# Patient Record
Sex: Male | Born: 1970 | Race: White | Hispanic: No | Marital: Married | Smoking: Current every day smoker
Health system: Southern US, Community
[De-identification: ages and names within clinical notes are randomized; demographics above are authoritative.]

## PROBLEM LIST (undated history)

## (undated) DIAGNOSIS — F419 Anxiety disorder, unspecified: Secondary | ICD-10-CM

## (undated) DIAGNOSIS — G709 Myoneural disorder, unspecified: Secondary | ICD-10-CM

## (undated) DIAGNOSIS — T7840XA Allergy, unspecified, initial encounter: Secondary | ICD-10-CM

## (undated) DIAGNOSIS — M1990 Unspecified osteoarthritis, unspecified site: Secondary | ICD-10-CM

## (undated) HISTORY — DX: Allergy, unspecified, initial encounter: T78.40XA

## (undated) HISTORY — PX: MOUTH SURGERY: SHX715

## (undated) HISTORY — DX: Myoneural disorder, unspecified: G70.9

## (undated) HISTORY — DX: Unspecified osteoarthritis, unspecified site: M19.90

## (undated) HISTORY — DX: Anxiety disorder, unspecified: F41.9

## (undated) HISTORY — PX: NO PAST SURGERIES: SHX2092

---

## 2004-09-20 ENCOUNTER — Emergency Department (HOSPITAL_COMMUNITY): Admission: EM | Admit: 2004-09-20 | Discharge: 2004-09-20 | Payer: Self-pay | Admitting: Family Medicine

## 2008-11-29 ENCOUNTER — Emergency Department (HOSPITAL_COMMUNITY): Admission: EM | Admit: 2008-11-29 | Discharge: 2008-11-29 | Payer: Self-pay | Admitting: Family Medicine

## 2011-01-05 ENCOUNTER — Encounter: Payer: Self-pay | Admitting: *Deleted

## 2011-01-05 ENCOUNTER — Emergency Department (INDEPENDENT_AMBULATORY_CARE_PROVIDER_SITE_OTHER)
Admission: EM | Admit: 2011-01-05 | Discharge: 2011-01-05 | Disposition: A | Discharge status: Home / Self Care | Payer: Worker's Compensation | Source: Home / Self Care | Attending: Emergency Medicine | Admitting: Emergency Medicine

## 2011-01-05 DIAGNOSIS — S61412A Laceration without foreign body of left hand, initial encounter: Secondary | ICD-10-CM

## 2011-01-05 DIAGNOSIS — S61409A Unspecified open wound of unspecified hand, initial encounter: Secondary | ICD-10-CM

## 2011-01-05 MED ORDER — TETANUS-DIPHTH-ACELL PERTUSSIS 5-2.5-18.5 LF-MCG/0.5 IM SUSP
INTRAMUSCULAR | Status: AC
  Filled 2011-01-05: qty 0.5

## 2011-01-05 MED ORDER — TETANUS-DIPHTH-ACELL PERTUSSIS 5-2.5-18.5 LF-MCG/0.5 IM SUSP
0.5000 mL | Freq: Once | INTRAMUSCULAR | Status: AC
  Administered 2011-01-05: 0.5 mL via INTRAMUSCULAR

## 2011-01-05 NOTE — ED Provider Notes (Signed)
History     CSN: [id number] Arrival date & time: 01/05/2011  4:16 PM   First MD Initiated Contact with Patient 01/05/11 1441      Chief Complaint  Patient presents with  . Laceration    (Consider location/radiation/quality/duration/timing/severity/associated sxs/prior treatment) HPI Comments: wAS WORKING CUT MY HAND WITH A DRILL. HAVE A LACERATION ON MY LEFT HAND, IT STOPPED BLEEDING, ITS SORE AND HURTS"  Patient is a 40 y.o. male presenting with skin laceration. The history is provided by the patient.  Laceration  The incident occurred 1 to 2 hours ago. The laceration is located on the left hand. The laceration is 2 cm in size. The pain is at a severity of 5/10. The pain is moderate. The pain has been constant since onset. He reports no foreign bodies present. His tetanus status is out of date.    History reviewed. No pertinent past medical history.  History reviewed. No pertinent past surgical history.  History reviewed. No pertinent family history.  History  Substance Use Topics  . Smoking status: Never Smoker   . Smokeless tobacco: Not on file  . Alcohol Use: Yes      Review of Systems  Constitutional: Negative for fever.  Neurological: Negative for weakness and numbness.    Allergies  Review of patient's allergies indicates no known allergies.  Home Medications  No current outpatient prescriptions on file.  BP 141/89  Pulse 69  Temp(Src) 97.7 F (36.5 C) (Oral)  Resp 16  SpO2 100%  Physical Exam  Nursing note and vitals reviewed. Constitutional: He appears well-developed.  Musculoskeletal:       Left hand: He exhibits laceration. He exhibits normal range of motion, normal two-point discrimination and no swelling. normal sensation noted. Normal strength noted. He exhibits no finger abduction, no thumb/finger opposition and no wrist extension trouble.       Hands: Neurological: He is alert. No cranial nerve deficit or sensory deficit. He exhibits normal  muscle tone.  Skin: Skin is warm. No erythema.    ED Course  LACERATION REPAIR Performed by: Kentrell Hallahan Authorized by: Jimmie Molly Consent: Verbal consent obtained. Consent given by: patient Body area: upper extremity Location details: left hand Laceration length: 2 cm Contamination: The wound is contaminated. Tendon involvement: none Nerve involvement: none Vascular damage: no Anesthesia: local infiltration Local anesthetic: lidocaine 1% without epinephrine Anesthetic total: 5 ml Patient sedated: no Irrigation solution: saline Debridement: minimal Skin closure: 4-0 Prolene Number of sutures: 3 Technique: simple Approximation: close Approximation difficulty: simple Dressing: antibiotic ointment   (including critical care time)  Labs Reviewed - No data to display No results found.   1. Laceration of left hand       MDM  Laceration- without tendon involvement Full ROM        Jimmie Molly, MD 01/05/11 1655

## 2011-01-05 NOTE — ED Notes (Signed)
Laceration left hand between thumb and index finger onset approx 2 hours ago with drill

## 2014-01-22 ENCOUNTER — Emergency Department (HOSPITAL_COMMUNITY)
Admission: EM | Admit: 2014-01-22 | Discharge: 2014-01-22 | Disposition: A | Discharge status: Home / Self Care | Payer: BC Managed Care – PPO | Source: Home / Self Care | Attending: Family Medicine | Admitting: Family Medicine

## 2014-01-22 ENCOUNTER — Encounter (HOSPITAL_COMMUNITY): Payer: Self-pay | Admitting: Emergency Medicine

## 2014-01-22 DIAGNOSIS — J0100 Acute maxillary sinusitis, unspecified: Secondary | ICD-10-CM

## 2014-01-22 DIAGNOSIS — K0889 Other specified disorders of teeth and supporting structures: Secondary | ICD-10-CM

## 2014-01-22 DIAGNOSIS — J069 Acute upper respiratory infection, unspecified: Secondary | ICD-10-CM

## 2014-01-22 DIAGNOSIS — K088 Other specified disorders of teeth and supporting structures: Secondary | ICD-10-CM

## 2014-01-22 DIAGNOSIS — R0982 Postnasal drip: Secondary | ICD-10-CM

## 2014-01-22 DIAGNOSIS — K047 Periapical abscess without sinus: Secondary | ICD-10-CM

## 2014-01-22 MED ORDER — AMOXICILLIN 500 MG PO CAPS
1000.0000 mg | ORAL_CAPSULE | Freq: Two times a day (BID) | ORAL | Status: DC
Start: 2014-01-22 — End: 2015-07-20

## 2014-01-22 NOTE — Discharge Instructions (Signed)
Upper Respiratory Infection, Adult Lots of nasal saline and often Pseudoephedrine for congestion  An upper respiratory infection (URI) is also sometimes known as the common cold. The upper respiratory tract includes the nose, sinuses, throat, trachea, and bronchi. Bronchi are the airways leading to the lungs. Most people improve within 1 week, but symptoms can last up to 2 weeks. A residual cough may last even longer.  CAUSES Many different viruses can infect the tissues lining the upper respiratory tract. The tissues become irritated and inflamed and often become very moist. Mucus production is also common. A cold is contagious. You can easily spread the virus to others by oral contact. This includes kissing, sharing a glass, coughing, or sneezing. Touching your mouth or nose and then touching a surface, which is then touched by another person, can also spread the virus. SYMPTOMS  Symptoms typically develop 1 to 3 days after you come in contact with a cold virus. Symptoms vary from person to person. They may include:  Runny nose.  Sneezing.  Nasal congestion.  Sinus irritation.  Sore throat.  Loss of voice (laryngitis).  Cough.  Fatigue.  Muscle aches.  Loss of appetite.  Headache.  Low-grade fever. DIAGNOSIS  You might diagnose your own cold based on familiar symptoms, since most people get a cold 2 to 3 times a year. Your caregiver can confirm this based on your exam. Most importantly, your caregiver can check that your symptoms are not due to another disease such as strep throat, sinusitis, pneumonia, asthma, or epiglottitis. Blood tests, throat tests, and X-rays are not necessary to diagnose a common cold, but they may sometimes be helpful in excluding other more serious diseases. Your caregiver will decide if any further tests are required. RISKS AND COMPLICATIONS  You may be at risk for a more severe case of the common cold if you smoke cigarettes, have chronic heart  disease (such as heart failure) or lung disease (such as asthma), or if you have a weakened immune system. The very young and very old are also at risk for more serious infections. Bacterial sinusitis, middle ear infections, and bacterial pneumonia can complicate the common cold. The common cold can worsen asthma and chronic obstructive pulmonary disease (COPD). Sometimes, these complications can require emergency medical care and may be life-threatening. PREVENTION  The best way to protect against getting a cold is to practice good hygiene. Avoid oral or hand contact with people with cold symptoms. Wash your hands often if contact occurs. There is no clear evidence that vitamin C, vitamin E, echinacea, or exercise reduces the chance of developing a cold. However, it is always recommended to get plenty of rest and practice good nutrition. TREATMENT  Treatment is directed at relieving symptoms. There is no cure. Antibiotics are not effective, because the infection is caused by a virus, not by bacteria. Treatment may include:  Increased fluid intake. Sports drinks offer valuable electrolytes, sugars, and fluids.  Breathing heated mist or steam (vaporizer or shower).  Eating chicken soup or other clear broths, and maintaining good nutrition.  Getting plenty of rest.  Using gargles or lozenges for comfort.  Controlling fevers with ibuprofen or acetaminophen as directed by your caregiver.  Increasing usage of your inhaler if you have asthma. Zinc gel and zinc lozenges, taken in the first 24 hours of the common cold, can shorten the duration and lessen the severity of symptoms. Pain medicines may help with fever, muscle aches, and throat pain. A variety of non-prescription medicines  are available to treat congestion and runny nose. Your caregiver can make recommendations and may suggest nasal or lung inhalers for other symptoms.  HOME CARE INSTRUCTIONS   Only take over-the-counter or prescription  medicines for pain, discomfort, or fever as directed by your caregiver.  Use a warm mist humidifier or inhale steam from a shower to increase air moisture. This may keep secretions moist and make it easier to breathe.  Drink enough water and fluids to keep your urine clear or pale yellow.  Rest as needed.  Return to work when your temperature has returned to normal or as your caregiver advises. You may need to stay home longer to avoid infecting others. You can also use a face mask and careful hand washing to prevent spread of the virus. SEEK MEDICAL CARE IF:   After the first few days, you feel you are getting worse rather than better.  You need your caregiver's advice about medicines to control symptoms.  You develop chills, worsening shortness of breath, or brown or red sputum. These may be signs of pneumonia.  You develop white or brown nasal discharge or pain in the face, especially when you bend forward. These may be signs of sinusitis.  You develop a fever, swollen neck glands, pain with swallowing, or white areas in the back of your throat. These may be signs of strep throat. SEEK IMMEDIATE MEDICAL CARE IF:   You have a fever.  You develop severe or persistent headache, ear pain, sinus pain, or chest pain.  You develop wheezing, a prolonged cough, cough up blood, or have a change in your usual mucus (if you have chronic lung disease).  You develop sore muscles or a stiff neck. Document Released: 07/10/2000 Document Revised: 04/08/2011 Document Reviewed: 04/21/2013 [facility] Patient Information 2015 [place], [place]. This information is not intended to replace advice given to you by your health care provider. Make sure you discuss any questions you have with your health care provider.  Sinusitis Sinusitis is redness, soreness, and puffiness (inflammation) of the air pockets in the bones of your face (sinuses). The redness, soreness, and puffiness can cause air and mucus to get  trapped in your sinuses. This can allow germs to grow and cause an infection.  HOME CARE   Drink enough fluids to keep your pee (urine) clear or pale yellow.  Use a humidifier in your home.  Run a hot shower to create steam in the bathroom. Sit in the bathroom with the door closed. Breathe in the steam 3-4 times a day.  Put a warm, moist washcloth on your face 3-4 times a day, or as told by your doctor.  Use salt water sprays (saline sprays) to wet the thick fluid in your nose. This can help the sinuses drain.  Only take medicine as told by your doctor. GET HELP RIGHT AWAY IF:   Your pain gets worse.  You have very bad headaches.  You are sick to your stomach (nauseous).  You throw up (vomit).  You are very sleepy (drowsy) all the time.  Your face is puffy (swollen).  Your vision changes.  You have a stiff neck.  You have trouble breathing. MAKE SURE YOU:   Understand these instructions.  Will watch your condition.  Will get help right away if you are not doing well or get worse. Document Released: 07/03/2007 Document Revised: 10/09/2011 Document Reviewed: 08/20/2011 [facility] Patient Information 2015 [place], [place]. This information is not intended to replace advice given to you by  your health care provider. Make sure you discuss any questions you have with your health care provider.  Dental Pain A tooth ache may be caused by cavities (tooth decay). Cavities expose the nerve of the tooth to air and hot or cold temperatures. It may come from an infection or abscess (also called a boil or furuncle) around your tooth. It is also often caused by dental caries (tooth decay). This causes the pain you are having. DIAGNOSIS  Your caregiver can diagnose this problem by exam. TREATMENT   If caused by an infection, it may be treated with medications which kill germs (antibiotics) and pain medications as prescribed by your caregiver. Take medications as directed.  Only take  over-the-counter or prescription medicines for pain, discomfort, or fever as directed by your caregiver.  Whether the tooth ache today is caused by infection or dental disease, you should see your dentist as soon as possible for further care. SEEK MEDICAL CARE IF: The exam and treatment you received today has been provided on an emergency basis only. This is not a substitute for complete medical or dental care. If your problem worsens or new problems (symptoms) appear, and you are unable to meet with your dentist, call or return to this location. SEEK IMMEDIATE MEDICAL CARE IF:   You have a fever.  You develop redness and swelling of your face, jaw, or neck.  You are unable to open your mouth.  You have severe pain uncontrolled by pain medicine. MAKE SURE YOU:   Understand these instructions.  Will watch your condition.  Will get help right away if you are not doing well or get worse. Document Released: 01/14/2005 Document Revised: 04/08/2011 Document Reviewed: 09/02/2007 [facility] Patient Information 2015 [place]. This information is not intended to replace advice given to you by your health care provider. Make sure you discuss any questions you have with your health care provider.  Dental Care and Dentist Visits Dental care supports good overall health. Regular dental visits can also help you avoid dental pain, bleeding, infection, and other more serious health problems in the future. It is important to keep the mouth healthy because diseases in the teeth, gums, and other oral tissues can spread to other areas of the body. Some problems, such as diabetes, heart disease, and pre-term labor have been associated with poor oral health.  See your dentist every 6 months. If you experience emergency problems such as a toothache or broken tooth, go to the dentist right away. If you see your dentist regularly, you may catch problems early. It is easier to be treated for problems in the  early stages.  WHAT TO EXPECT AT A DENTIST VISIT  Your dentist will look for many common oral health problems and recommend proper treatment. At your regular dental visit, you can expect:  Gentle cleaning of the teeth and gums. This includes scraping and polishing. This helps to remove the sticky substance around the teeth and gums (plaque). Plaque forms in the mouth shortly after eating. Over time, plaque hardens on the teeth as tartar. If tartar is not removed regularly, it can cause problems. Cleaning also helps remove stains.  Periodic X-rays. These pictures of the teeth and supporting bone will help your dentist assess the health of your teeth.  Periodic fluoride treatments. Fluoride is a natural mineral shown to help strengthen teeth. Fluoride treatmentinvolves applying a fluoride gel or varnish to the teeth. It is most commonly done in children.  Examination of the mouth, tongue,  jaws, teeth, and gums to look for any oral health problems, such as:  Cavities (dental caries). This is decay on the tooth caused by plaque, sugar, and acid in the mouth. It is best to catch a cavity when it is small.  Inflammation of the gums caused by plaque buildup (gingivitis).  Problems with the mouth or malformed or misaligned teeth.  Oral cancer or other diseases of the soft tissues or jaws. KEEP YOUR TEETH AND GUMS HEALTHY For healthy teeth and gums, follow these general guidelines as well as your dentist's specific advice:  Have your teeth professionally cleaned at the dentist every 6 months.  Brush twice daily with a fluoride toothpaste.  Floss your teeth daily.  Ask your dentist if you need fluoride supplements, treatments, or fluoride toothpaste.  Eat a healthy diet. Reduce foods and drinks with added sugar.  Avoid smoking. TREATMENT FOR ORAL HEALTH PROBLEMS If you have oral health problems, treatment varies depending on the conditions present in your teeth and gums.  Your caregiver  will most likely recommend good oral hygiene at each visit.  For cavities, gingivitis, or other oral health disease, your caregiver will perform a procedure to treat the problem. This is typically done at a separate appointment. Sometimes your caregiver will refer you to another dental specialist for specific tooth problems or for surgery. SEEK IMMEDIATE DENTAL CARE IF:  You have pain, bleeding, or soreness in the gum, tooth, jaw, or mouth area.  A permanent tooth becomes loose or separated from the gum socket.  You experience a blow or injury to the mouth or jaw area. Document Released: 09/26/2010 Document Revised: 04/08/2011 Document Reviewed: 09/26/2010 [facility] Patient Information 2015 [place], [place]. This information is not intended to replace advice given to you by your health care provider. Make sure you discuss any questions you have with your health care provider.

## 2014-01-22 NOTE — ED Provider Notes (Signed)
CSN: [id number]     Arrival date & time 01/22/14  1544 History   First MD Initiated Contact with Patient 01/22/14 1615     Chief Complaint  Patient presents with  . URI   (Consider location/radiation/quality/duration/timing/severity/associated sxs/prior Treatment) HPI Comments: Cold and sinus congestion for 2 weeks. Family members with same.  Also with tooth ache right upper premolar. Taking Sudafed.  No fevers.   No past medical history on file. No past surgical history on file. No family history on file. History  Substance Use Topics  . Smoking status: Never Smoker   . Smokeless tobacco: Not on file  . Alcohol Use: Yes    Review of Systems  Constitutional: Positive for activity change. Negative for fever, diaphoresis and fatigue.  HENT: Positive for congestion, postnasal drip and rhinorrhea. Negative for ear pain, facial swelling, sore throat and trouble swallowing.   Eyes: Negative for pain, discharge and redness.  Respiratory: Negative for cough, chest tightness and shortness of breath.   Cardiovascular: Negative.   Gastrointestinal: Negative.   Musculoskeletal: Negative.  Negative for neck pain and neck stiffness.  Skin: Negative for rash.  Neurological: Negative.     Allergies  Review of patient's allergies indicates no known allergies.  Home Medications   Prior to Admission medications   Medication Sig Start Date End Date Taking? Authorizing Provider  amoxicillin (AMOXIL) 500 MG capsule Take 2 capsules (1,000 mg total) by mouth 2 (two) times daily. 01/22/14   Hayden Rasmussenavid Devann Cribb, NP   BP 151/89 mmHg  Pulse 75  Temp(Src) 99.1 F (37.3 C) (Oral)  Resp 16  SpO2 97% Physical Exam  Constitutional: He is oriented to person, place, and time. He appears well-developed and well-nourished. No distress.  HENT:  Mouth/Throat: No oropharyngeal exudate.  Bilateral EAC's occluded with wax  OP with minor erythema  No facial tenderness Positive dental tenderness to the left  upper too #4. Mild surrounding swelling and redness  Eyes: Conjunctivae and EOM are normal.  Neck: Normal range of motion. Neck supple.  Cardiovascular: Normal rate, regular rhythm and normal heart sounds.   Pulmonary/Chest: Effort normal and breath sounds normal. No respiratory distress. He has no wheezes. He has no rales.  Musculoskeletal: Normal range of motion. He exhibits no edema.  Lymphadenopathy:    He has no cervical adenopathy.  Neurological: He is alert and oriented to person, place, and time.  Skin: Skin is warm and dry. No rash noted.  Psychiatric: He has a normal mood and affect.  Nursing note and vitals reviewed.   ED Course  Procedures (including critical care time) Labs Review Labs Reviewed - No data to display  Imaging Review No results found.   MDM   1. URI (upper respiratory infection)   2. Acute maxillary sinusitis, recurrence not specified   3. PND (post-nasal drip)   4. Toothache   5. Dental infection    Lots of nasal saline and often Pseudoephedrine for congestion Lots of water Amoxicillin for tooth See dentist as soon as possible      Hayden Rasmussenavid Zanobia Griebel, NP 01/22/14 1648

## 2014-01-22 NOTE — ED Notes (Signed)
Uri x 2 weeks, tried sudafed, mucinex, advil.

## 2015-03-11 ENCOUNTER — Emergency Department (HOSPITAL_COMMUNITY)
Admission: EM | Admit: 2015-03-11 | Discharge: 2015-03-11 | Disposition: A | Discharge status: Home / Self Care | Payer: BLUE CROSS/BLUE SHIELD | Source: Home / Self Care | Attending: Family Medicine | Admitting: Family Medicine

## 2015-03-11 ENCOUNTER — Encounter (HOSPITAL_COMMUNITY): Payer: Self-pay | Admitting: Emergency Medicine

## 2015-03-11 DIAGNOSIS — J111 Influenza due to unidentified influenza virus with other respiratory manifestations: Secondary | ICD-10-CM

## 2015-03-11 DIAGNOSIS — R69 Illness, unspecified: Principal | ICD-10-CM

## 2015-03-11 NOTE — ED Provider Notes (Signed)
CSN: [id number]     Arrival date & time 03/11/15  1515 History   First MD Initiated Contact with Patient 03/11/15 1543     No chief complaint on file.  (Consider location/radiation/quality/duration/timing/severity/associated sxs/prior Treatment) Patient is a 45 y.o. male presenting with flu symptoms. The history is provided by the patient.  Influenza Presenting symptoms: cough, diarrhea, fever, myalgias and rhinorrhea   Presenting symptoms: no sore throat   Severity:  Mild Onset quality:  Gradual Duration:  3 days Progression:  Waxing and waning Chronicity:  New Relieved by:  None tried Ineffective treatments:  None tried Associated symptoms: chills   Risk factors comment:  No flu vaccine.   No past medical history on file. No past surgical history on file. No family history on file. Social History  Substance Use Topics  . Smoking status: Never Smoker   . Smokeless tobacco: Not on file  . Alcohol Use: Yes    Review of Systems  Constitutional: Positive for fever and chills.  HENT: Positive for rhinorrhea. Negative for sore throat.   Respiratory: Positive for cough.   Cardiovascular: Negative.   Gastrointestinal: Positive for diarrhea.  Genitourinary: Negative.   Musculoskeletal: Positive for myalgias.  All other systems reviewed and are negative.   Allergies  Review of patient's allergies indicates no known allergies.  Home Medications   Prior to Admission medications   Medication Sig Start Date End Date Taking? Authorizing Provider  amoxicillin (AMOXIL) 500 MG capsule Take 2 capsules (1,000 mg total) by mouth 2 (two) times daily. 01/22/14   Hayden Rasmussen, NP  ibuprofen (ADVIL,MOTRIN) 200 MG tablet Take 200 mg by mouth every 6 (six) hours as needed.    Historical Provider, MD   Meds Ordered and Administered this Visit  Medications - No data to display  BP 126/76 mmHg  Pulse 105  Temp(Src) 101.1 F (38.4 C) (Oral)  SpO2 96% No data found.   Physical Exam   Constitutional: He is oriented to person, place, and time. He appears well-developed and well-nourished. No distress.  HENT:  Head: Normocephalic.  Right Ear: External ear normal.  Left Ear: External ear normal.  Mouth/Throat: Oropharynx is clear and moist.  Neck: Normal range of motion. Neck supple.  Pulmonary/Chest: Effort normal.  Abdominal: Soft. Bowel sounds are normal.  Lymphadenopathy:    He has no cervical adenopathy.  Neurological: He is alert and oriented to person, place, and time.  Skin: Skin is warm and dry. No rash noted.  Nursing note and vitals reviewed.   ED Course  Procedures (including critical care time)  Labs Review Labs Reviewed - No data to display  Imaging Review No results found.   Visual Acuity Review  Right Eye Distance:   Left Eye Distance:   Bilateral Distance:    Right Eye Near:   Left Eye Near:    Bilateral Near:         MDM   1. Influenza-like illness        Linna Hoff, MD 03/11/15 [phone]

## 2015-03-11 NOTE — Discharge Instructions (Signed)
Drink plenty of fluids as discussed, use tylenol or advil for fever and mucinex or delsym for cough. Return or see your doctor if further problems °

## 2015-06-05 DIAGNOSIS — Z0000 Encounter for general adult medical examination without abnormal findings: Secondary | ICD-10-CM | POA: Diagnosis not present

## 2015-06-05 DIAGNOSIS — Z125 Encounter for screening for malignant neoplasm of prostate: Secondary | ICD-10-CM | POA: Diagnosis not present

## 2015-06-12 DIAGNOSIS — Z6825 Body mass index (BMI) 25.0-25.9, adult: Secondary | ICD-10-CM | POA: Diagnosis not present

## 2015-06-12 DIAGNOSIS — Z1389 Encounter for screening for other disorder: Secondary | ICD-10-CM | POA: Diagnosis not present

## 2015-06-12 DIAGNOSIS — Z0000 Encounter for general adult medical examination without abnormal findings: Secondary | ICD-10-CM | POA: Diagnosis not present

## 2015-07-20 ENCOUNTER — Emergency Department (HOSPITAL_COMMUNITY): Payer: Self-pay

## 2015-07-20 ENCOUNTER — Emergency Department (HOSPITAL_COMMUNITY)
Admission: EM | Admit: 2015-07-20 | Discharge: 2015-07-20 | Disposition: A | Discharge status: Home / Self Care | Payer: Self-pay | Attending: Emergency Medicine | Admitting: Emergency Medicine

## 2015-07-20 ENCOUNTER — Encounter (HOSPITAL_COMMUNITY): Payer: Self-pay | Admitting: *Deleted

## 2015-07-20 DIAGNOSIS — R1011 Right upper quadrant pain: Secondary | ICD-10-CM | POA: Insufficient documentation

## 2015-07-20 DIAGNOSIS — R109 Unspecified abdominal pain: Secondary | ICD-10-CM

## 2015-07-20 LAB — LIPASE, BLOOD: LIPASE: 22 U/L (ref 11–51)

## 2015-07-20 LAB — URINALYSIS, ROUTINE W REFLEX MICROSCOPIC
Bilirubin Urine: NEGATIVE
Glucose, UA: NEGATIVE mg/dL
Hgb urine dipstick: NEGATIVE
Ketones, ur: 15 mg/dL — AB
LEUKOCYTES UA: NEGATIVE
NITRITE: NEGATIVE
PH: 5.5 (ref 5.0–8.0)
Protein, ur: NEGATIVE mg/dL
SPECIFIC GRAVITY, URINE: 1.027 (ref 1.005–1.030)

## 2015-07-20 LAB — CBC
HEMATOCRIT: 42.6 % (ref 39.0–52.0)
HEMOGLOBIN: 14.5 g/dL (ref 13.0–17.0)
MCH: 31.6 pg (ref 26.0–34.0)
MCHC: 34 g/dL (ref 30.0–36.0)
MCV: 92.8 fL (ref 78.0–100.0)
Platelets: 249 10*3/uL (ref 150–400)
RBC: 4.59 MIL/uL (ref 4.22–5.81)
RDW: 12.9 % (ref 11.5–15.5)
WBC: 10.9 10*3/uL — AB (ref 4.0–10.5)

## 2015-07-20 LAB — COMPREHENSIVE METABOLIC PANEL
ALBUMIN: 4 g/dL (ref 3.5–5.0)
ALT: 19 U/L (ref 17–63)
ANION GAP: 9 (ref 5–15)
AST: 25 U/L (ref 15–41)
Alkaline Phosphatase: 53 U/L (ref 38–126)
BILIRUBIN TOTAL: 0.7 mg/dL (ref 0.3–1.2)
BUN: 21 mg/dL — ABNORMAL HIGH (ref 6–20)
CO2: 21 mmol/L — ABNORMAL LOW (ref 22–32)
Calcium: 9.2 mg/dL (ref 8.9–10.3)
Chloride: 105 mmol/L (ref 101–111)
Creatinine, Ser: 0.98 mg/dL (ref 0.61–1.24)
Glucose, Bld: 109 mg/dL — ABNORMAL HIGH (ref 65–99)
POTASSIUM: 3.8 mmol/L (ref 3.5–5.1)
SODIUM: 135 mmol/L (ref 135–145)
TOTAL PROTEIN: 6.7 g/dL (ref 6.5–8.1)

## 2015-07-20 LAB — CBC WITH DIFFERENTIAL/PLATELET
BASOS PCT: 1 %
Basophils Absolute: 0.1 10*3/uL (ref 0.0–0.1)
Eosinophils Absolute: 0.1 10*3/uL (ref 0.0–0.7)
Eosinophils Relative: 1 %
LYMPHS ABS: 1.2 10*3/uL (ref 0.7–4.0)
Lymphocytes Relative: 11 %
MONOS PCT: 3 %
Monocytes Absolute: 0.3 10*3/uL (ref 0.1–1.0)
NEUTROS ABS: 8.9 10*3/uL — AB (ref 1.7–7.7)
NEUTROS PCT: 84 %

## 2015-07-20 MED ORDER — IOPAMIDOL (ISOVUE-300) INJECTION 61%
100.0000 mL | Freq: Once | INTRAVENOUS | Status: AC | PRN
  Administered 2015-07-20: 100 mL via INTRAVENOUS

## 2015-07-20 MED ORDER — PANTOPRAZOLE SODIUM 20 MG PO TBEC: 20.0000 mg | DELAYED_RELEASE_TABLET | Freq: Every day | ORAL | Status: DC

## 2015-07-20 MED ORDER — FENTANYL CITRATE (PF) 100 MCG/2ML IJ SOLN
50.0000 ug | INTRAMUSCULAR | Status: DC | PRN
  Administered 2015-07-20: 50 ug via INTRAVENOUS
  Filled 2015-07-20: qty 2

## 2015-07-20 MED ORDER — FAMOTIDINE IN NACL 20-0.9 MG/50ML-% IV SOLN
20.0000 mg | Freq: Once | INTRAVENOUS | Status: AC
  Administered 2015-07-20: 20 mg via INTRAVENOUS
  Filled 2015-07-20: qty 50

## 2015-07-20 MED ORDER — ONDANSETRON HCL 4 MG/2ML IJ SOLN
4.0000 mg | Freq: Once | INTRAMUSCULAR | Status: AC
  Administered 2015-07-20: 4 mg via INTRAVENOUS
  Filled 2015-07-20: qty 2

## 2015-07-20 MED ORDER — DICYCLOMINE HCL 20 MG PO TABS: ORAL_TABLET | ORAL | Status: DC

## 2015-07-20 NOTE — ED Provider Notes (Signed)
CSN: [id number]     Arrival date & time 07/20/15  [id number] History   First MD Initiated Contact with Patient 07/20/15 [phone]     Chief Complaint  Patient presents with  . Abdominal Pain     (Consider location/radiation/quality/duration/timing/severity/associated sxs/prior Treatment) Patient is a 45 y.o. male presenting with abdominal pain. The history is provided by the patient (Patient awoke with right upper quadrant abdominal pain at 3 morning).  Abdominal Pain Pain quality: aching   Pain radiates to:  Does not radiate Pain severity:  Moderate Onset quality:  Sudden Timing:  Intermittent Progression:  Waxing and waning Chronicity:  New Context: not alcohol use   Associated symptoms: no chest pain, no cough, no diarrhea, no fatigue and no hematuria     History reviewed. No pertinent past medical history. History reviewed. No pertinent past surgical history. No family history on file. Social History  Substance Use Topics  . Smoking status: Never Smoker   . Smokeless tobacco: None  . Alcohol Use: Yes    Review of Systems  Constitutional: Negative for appetite change and fatigue.  HENT: Negative for congestion, ear discharge and sinus pressure.   Eyes: Negative for discharge.  Respiratory: Negative for cough.   Cardiovascular: Negative for chest pain.  Gastrointestinal: Positive for abdominal pain. Negative for diarrhea.  Genitourinary: Negative for frequency and hematuria.  Musculoskeletal: Negative for back pain.  Skin: Negative for rash.  Neurological: Negative for seizures and headaches.  Psychiatric/Behavioral: Negative for hallucinations.      Allergies  Review of patient's allergies indicates no known allergies.  Home Medications   Prior to Admission medications   Medication Sig Start Date End Date Taking? Authorizing Provider  ibuprofen (ADVIL,MOTRIN) 200 MG tablet Take 200 mg by mouth every 6 (six) hours as needed.    Historical Provider, MD   BP 128/86 mmHg   Pulse 67  Temp(Src) 98.8 F (37.1 C)  Resp 17  Ht 5\' 10"  (1.778 m)  Wt 180 lb (81.647 kg)  BMI 25.83 kg/m2  SpO2 99% Physical Exam  Constitutional: He is oriented to person, place, and time. He appears well-developed.  HENT:  Head: Normocephalic.  Eyes: Conjunctivae and EOM are normal. No scleral icterus.  Neck: Neck supple. No thyromegaly present.  Cardiovascular: Normal rate and regular rhythm.  Exam reveals no gallop and no friction rub.   No murmur heard. Pulmonary/Chest: No stridor. He has no wheezes. He has no rales. He exhibits no tenderness.  Abdominal: He exhibits no distension. There is tenderness. There is no rebound.  Tender right upper quadrant  Musculoskeletal: Normal range of motion. He exhibits no edema.  Lymphadenopathy:    He has no cervical adenopathy.  Neurological: He is oriented to person, place, and time. He exhibits normal muscle tone. Coordination normal.  Skin: No rash noted. No erythema.  Psychiatric: He has a normal mood and affect. His behavior is normal.    ED Course  Procedures (including critical care time) Labs Review Labs Reviewed  CBC - Abnormal; Notable for the following:    WBC 10.9 (*)    All other components within normal limits  URINALYSIS, ROUTINE W REFLEX MICROSCOPIC (NOT AT [facility]) - Abnormal; Notable for the following:    Ketones, ur 15 (*)    All other components within normal limits  LIPASE, BLOOD  COMPREHENSIVE METABOLIC PANEL  CBC WITH DIFFERENTIAL/PLATELET    Imaging Review No results found. I have personally reviewed and evaluated these images and lab results as part of  my medical decision-making.   EKG Interpretation None      MDM   Final diagnoses:  None    Patient with abdominal pain. Symptoms have improved with a medicine. Ultrasound and CT are negative. Patient will be put on protonic and Bentyl and will follow-up with his PCP   Bethann BerkshireJoseph Vallery Mcdade, MD 07/20/15 1048

## 2015-07-20 NOTE — ED Notes (Signed)
Pt c/o sudden onset of RUQ pain at 0300 associated with nausea. Pt's last PO intake at 9pm.

## 2015-07-20 NOTE — Discharge Instructions (Signed)
Follow up with your md next week. °

## 2015-11-21 DIAGNOSIS — M791 Myalgia: Secondary | ICD-10-CM | POA: Diagnosis not present

## 2015-11-21 DIAGNOSIS — M9905 Segmental and somatic dysfunction of pelvic region: Secondary | ICD-10-CM | POA: Diagnosis not present

## 2015-11-21 DIAGNOSIS — S76301A Unspecified injury of muscle, fascia and tendon of the posterior muscle group at thigh level, right thigh, initial encounter: Secondary | ICD-10-CM | POA: Diagnosis not present

## 2015-11-21 DIAGNOSIS — M9903 Segmental and somatic dysfunction of lumbar region: Secondary | ICD-10-CM | POA: Diagnosis not present

## 2015-12-12 DIAGNOSIS — R05 Cough: Secondary | ICD-10-CM | POA: Diagnosis not present

## 2015-12-12 DIAGNOSIS — J069 Acute upper respiratory infection, unspecified: Secondary | ICD-10-CM | POA: Diagnosis not present

## 2016-06-13 DIAGNOSIS — M25512 Pain in left shoulder: Secondary | ICD-10-CM | POA: Diagnosis not present

## 2016-06-13 DIAGNOSIS — Z125 Encounter for screening for malignant neoplasm of prostate: Secondary | ICD-10-CM | POA: Diagnosis not present

## 2016-06-13 DIAGNOSIS — Z0000 Encounter for general adult medical examination without abnormal findings: Secondary | ICD-10-CM | POA: Diagnosis not present

## 2016-06-13 DIAGNOSIS — M25519 Pain in unspecified shoulder: Secondary | ICD-10-CM | POA: Diagnosis not present

## 2016-06-14 ENCOUNTER — Encounter: Payer: Self-pay | Admitting: Family Medicine

## 2016-06-14 ENCOUNTER — Ambulatory Visit (INDEPENDENT_AMBULATORY_CARE_PROVIDER_SITE_OTHER): Payer: BLUE CROSS/BLUE SHIELD | Admitting: Family Medicine

## 2016-06-14 ENCOUNTER — Encounter (INDEPENDENT_AMBULATORY_CARE_PROVIDER_SITE_OTHER): Payer: Self-pay

## 2016-06-14 VITALS — BP 129/90 | Ht 70.0 in | Wt 172.0 lb

## 2016-06-14 DIAGNOSIS — M25512 Pain in left shoulder: Secondary | ICD-10-CM

## 2016-06-14 MED ORDER — MELOXICAM 15 MG PO TABS: 15.0000 mg | ORAL_TABLET | Freq: Every day | ORAL | 0 refills | Status: AC

## 2016-06-17 DIAGNOSIS — H538 Other visual disturbances: Secondary | ICD-10-CM | POA: Diagnosis not present

## 2016-06-17 DIAGNOSIS — M25512 Pain in left shoulder: Secondary | ICD-10-CM | POA: Insufficient documentation

## 2016-06-17 DIAGNOSIS — Z6825 Body mass index (BMI) 25.0-25.9, adult: Secondary | ICD-10-CM | POA: Diagnosis not present

## 2016-06-17 DIAGNOSIS — Z0000 Encounter for general adult medical examination without abnormal findings: Secondary | ICD-10-CM | POA: Diagnosis not present

## 2016-06-17 DIAGNOSIS — Z1389 Encounter for screening for other disorder: Secondary | ICD-10-CM | POA: Diagnosis not present

## 2016-06-17 NOTE — Assessment & Plan Note (Signed)
He has a BT tear and possible labral pathology. He has some pain with IR and US may show a small defect of the subscapularis. Has a significant amount of edema around the BT.  - discussed his different options today. Will proceed with conservative therapy for now. He will follow up in 2 weeks and if still in pain then can consider an injection into the BT and would re-scan with the US.

## 2016-06-17 NOTE — Progress Notes (Signed)
  Jonathan Rasmussen - 46 y.o. male MRN [id number]  Date of birth: [birth year]  SUBJECTIVE:  Including CC & ROS.   [name] is a 46 yo M that is presenting with acute left shoulder pain. He reports he was helping move furniture about two weeks ago and felt a pop in his shoulder.  The pain has gotten worse the more he has used it.  He has had significant pain around the anterior aspect of his shoulder. He felt like it moved when it happened. He was seen at urgent care and xrays were normal. He was provided flexeril and norco.   ROS: No unexpected weight loss, fever, chills, swelling, instability, muscle pain, numbness/tingling, redness, otherwise see HPI   HISTORY: Past Medical, Surgical, Social, and Family History Reviewed & Updated per EMR.   Pertinent Historical Findings include: PMHx: none   Social:  Nonsmoker  FHx: none  DATA REVIEWED: Reviewed xray from urgent care   PHYSICAL EXAM:  VS: BP 129/90   Ht 5\' 10"  (1.778 m)   Wt 172 lb (78 kg)   BMI 24.68 kg/m  PHYSICAL EXAM: Gen: NAD, alert, cooperative with exam, well-appearing HEENT: clear conjunctiva, EOMI CV:  no edema, capillary refill brisk,  Resp: non-labored, normal speech Skin: no rashes, normal turgor  Neuro: no gross deficits.  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  Some ecchymosis of the left bicep  Able to achieve active abduction and flexion FROM Normal ER  Normal strength with ER  Some pain with IR  Normal empty can testing and Hawkin's testing  Pain with speed's testing  Some pain with O'Brien's testing  Neurovascularly intact   ultrasound: left shoulder :  Edema around the BT  BT appears to be partially torn with some fibers intact in long and short axis  Subscapularis may have small defect Supraspinatus, infraspinatus and TM are normal.  AC joint is normal   Summary: Findings consistent with BT partial tear with possible defect of subscapularis and  possible labral tear.      Ultrasound and interpretation by Clare GandyJeremy Anhad Sheeley, MD    ASSESSMENT & PLAN:   Acute pain of left shoulder He has a BT tear and possible labral pathology. He has some pain with IR and US may show a small defect of the subscapularis. Has a significant amount of edema around the BT.  - discussed his different options today. Will proceed with conservative therapy for now. He will follow up in 2 weeks and if still in pain then can consider an injection into the BT and would re-scan with the US.

## 2016-06-26 ENCOUNTER — Ambulatory Visit: Payer: BLUE CROSS/BLUE SHIELD | Admitting: Family Medicine

## 2016-08-06 DIAGNOSIS — H524 Presbyopia: Secondary | ICD-10-CM | POA: Diagnosis not present

## 2018-06-02 IMAGING — US US ABDOMEN COMPLETE
1 series · 14 of 25 positions shown · non-contrast
Comparison: None.

CLINICAL DATA: Right upper quadrant pain

EXAM:
ABDOMEN ULTRASOUND COMPLETE

[Series 1: us abdomen complete · 0.22mm/px · 14 of 71 slices shown]
[im 1/71]
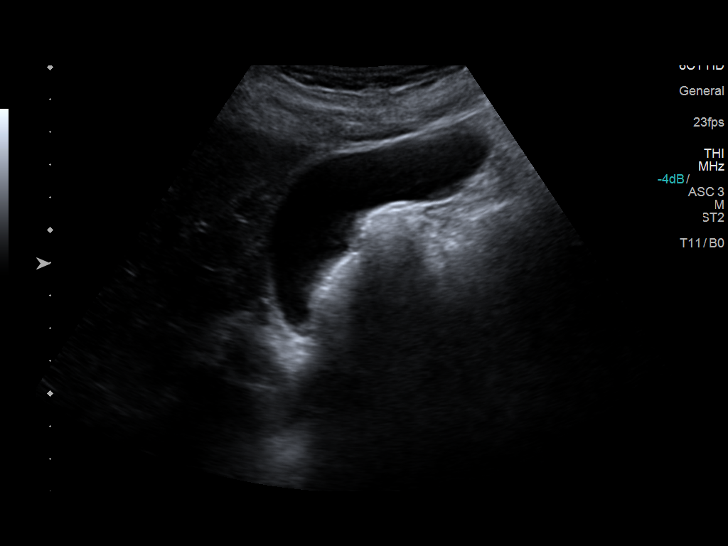
[im 6/71]
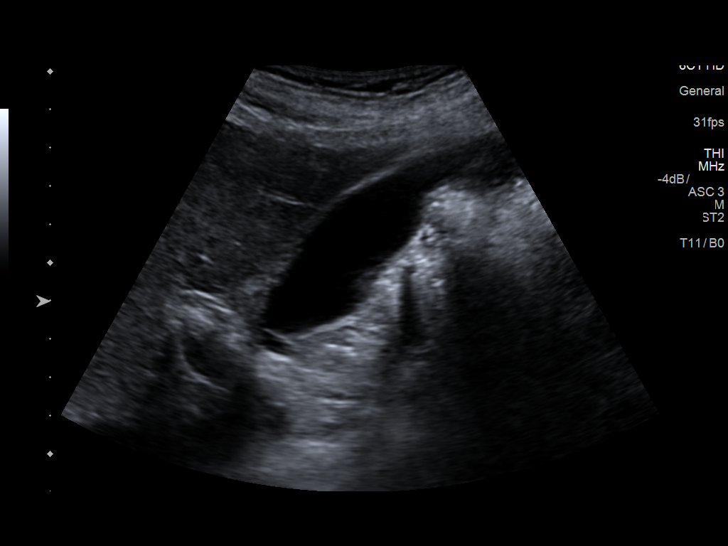
[im 12/71]
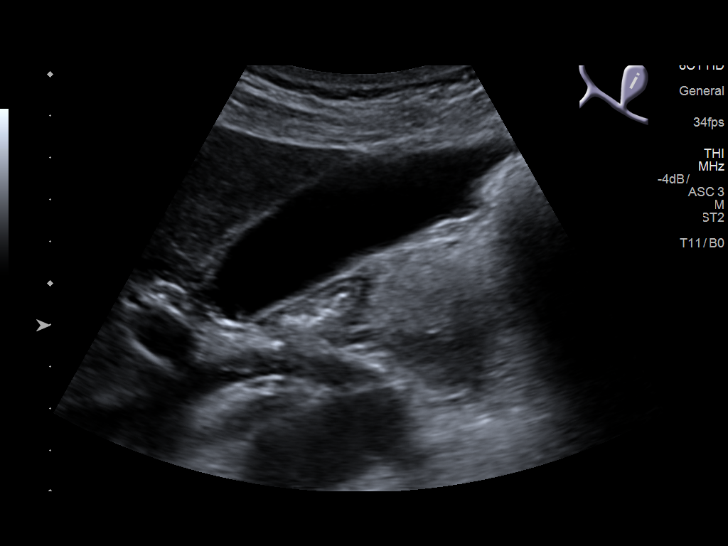
[im 18/71]
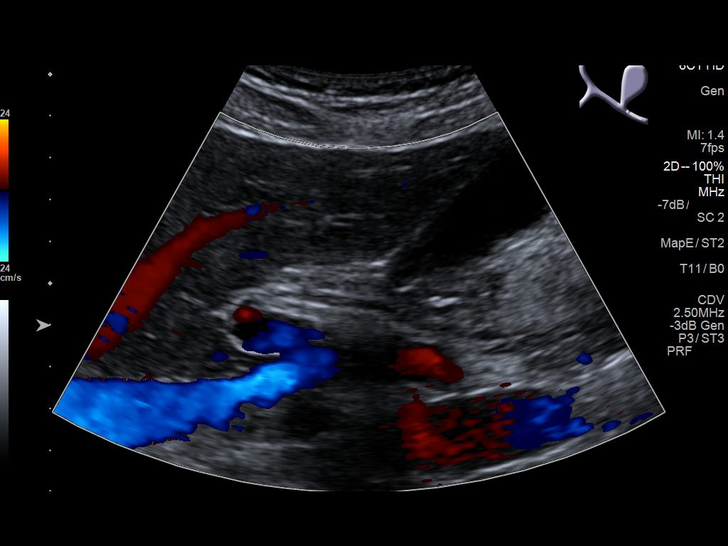
[im 24/71]
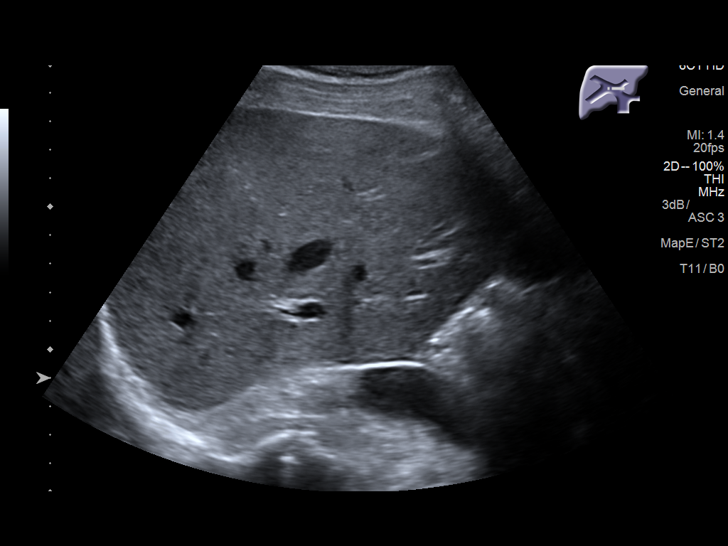
[im 27/71]
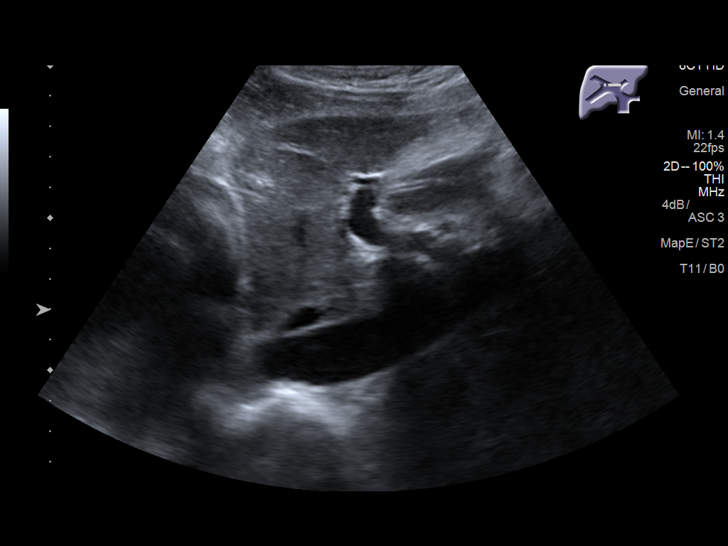
[im 33/71]
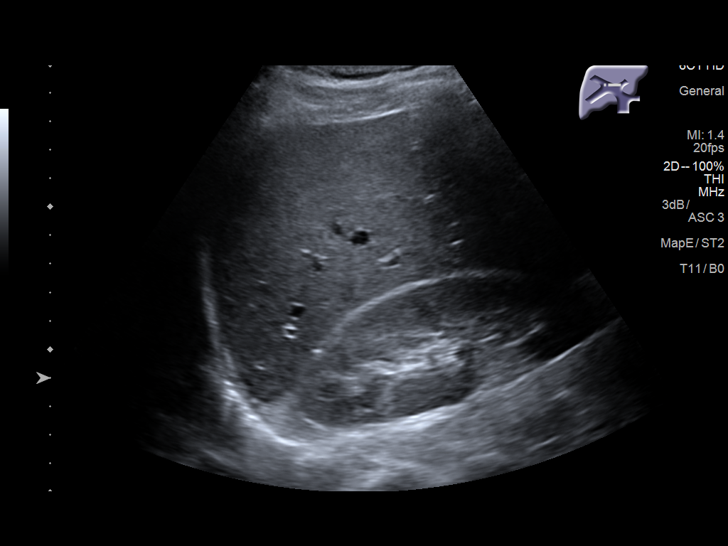
[im 38/71]
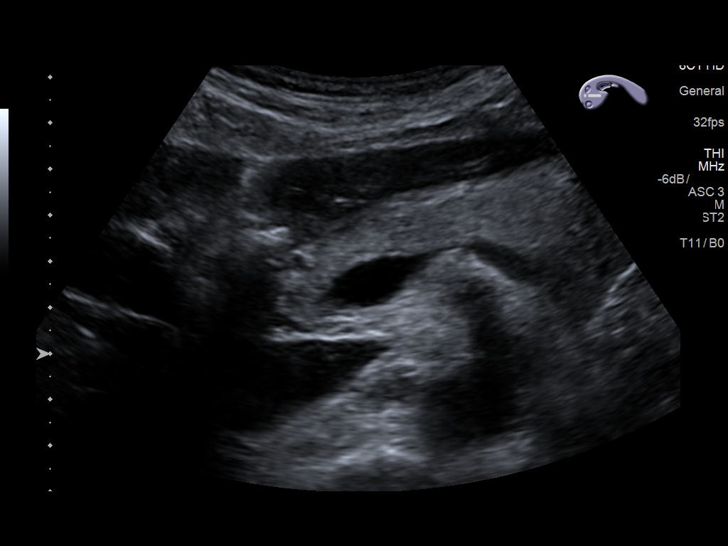
[im 44/71]
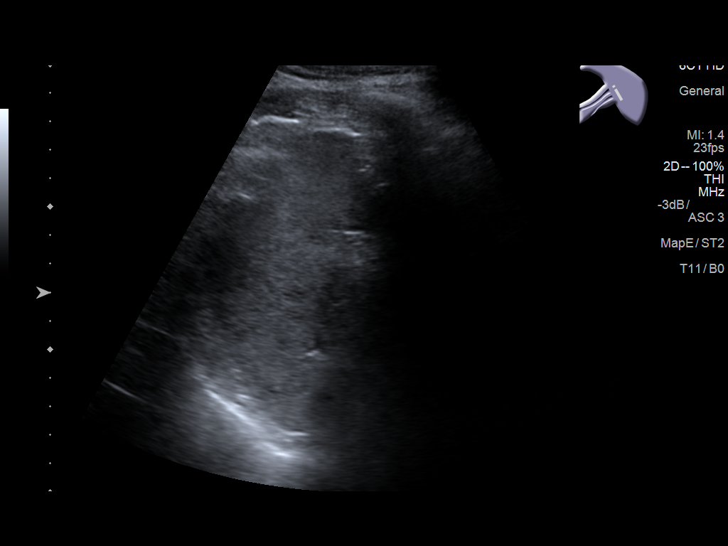
[im 47/71]
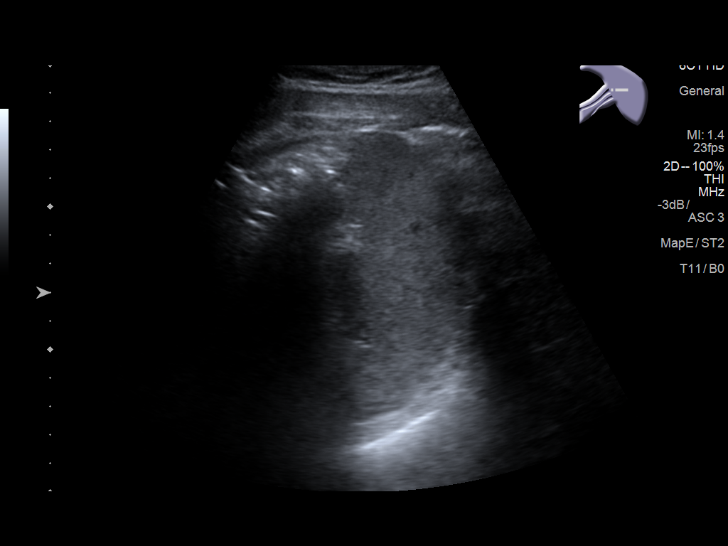
[im 53/71]
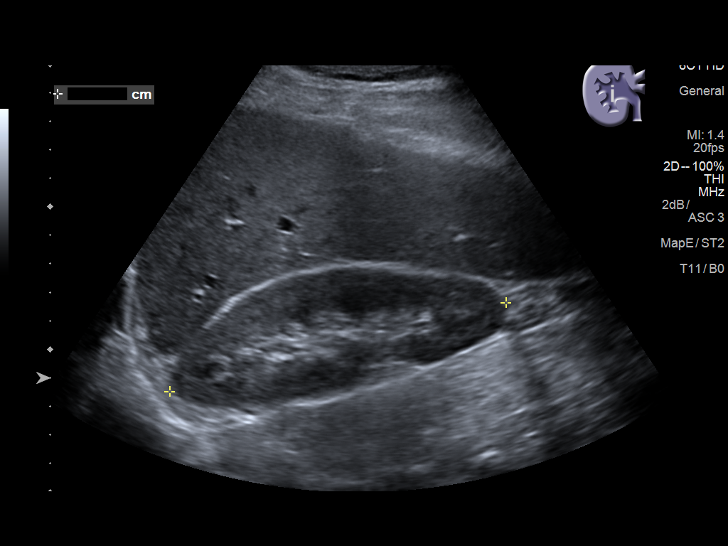
[im 59/71]
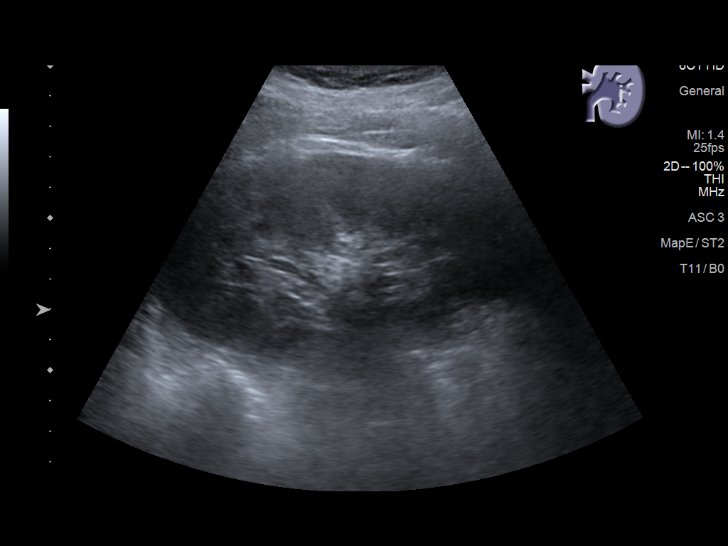
[im 65/71]
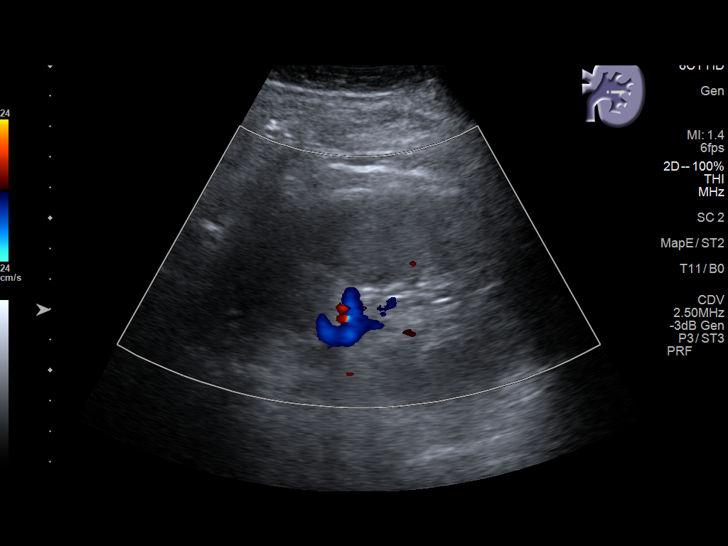
[im 71/71]
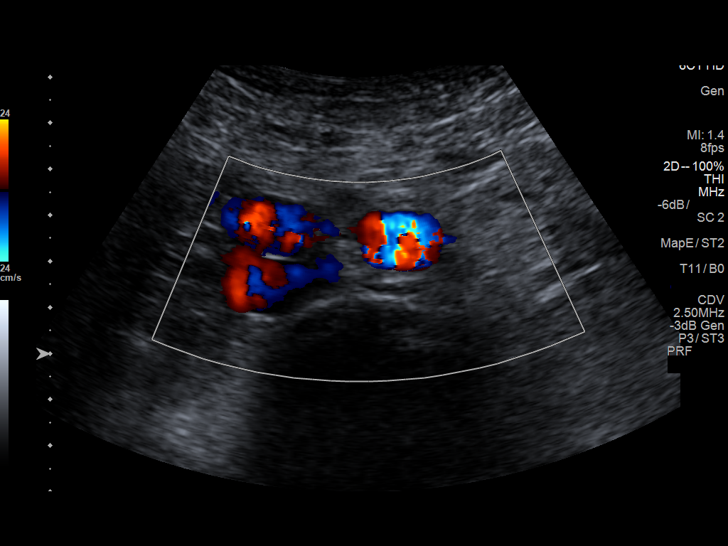

[14 of 25 positions shown; findings below may reference images not displayed]

FINDINGS: Gallbladder: No gallstones or wall thickening visualized. No
sonographic Murphy sign noted by sonographer.

Common bile duct: Diameter: 3.2 mm

Liver: No focal lesion identified. Within normal limits in
parenchymal echogenicity.

IVC: No abnormality visualized.

Pancreas: Visualized portion unremarkable.

Spleen: Size and appearance within normal limits.

Right Kidney: Length: 12.2 cm. Echogenicity within normal limits. No
mass or hydronephrosis visualized.

Left Kidney: Length: 12.6 cm. Echogenicity within normal limits. No
mass or hydronephrosis visualized.

Abdominal aorta: No aneurysm visualized.

Other findings: None.
IMPRESSION: Negative abdominal ultrasound

## 2018-07-28 DIAGNOSIS — F4323 Adjustment disorder with mixed anxiety and depressed mood: Secondary | ICD-10-CM | POA: Diagnosis not present

## 2018-10-11 DIAGNOSIS — S90412A Abrasion, left great toe, initial encounter: Secondary | ICD-10-CM | POA: Diagnosis not present

## 2019-02-19 DIAGNOSIS — S61219D Laceration without foreign body of unspecified finger without damage to nail, subsequent encounter: Secondary | ICD-10-CM | POA: Diagnosis not present

## 2019-02-27 DIAGNOSIS — Z20828 Contact with and (suspected) exposure to other viral communicable diseases: Secondary | ICD-10-CM | POA: Diagnosis not present

## 2019-04-12 ENCOUNTER — Other Ambulatory Visit: Payer: Self-pay

## 2019-04-12 ENCOUNTER — Ambulatory Visit (INDEPENDENT_AMBULATORY_CARE_PROVIDER_SITE_OTHER): Payer: BC Managed Care – PPO | Admitting: Mental Health

## 2019-04-12 DIAGNOSIS — F331 Major depressive disorder, recurrent, moderate: Secondary | ICD-10-CM

## 2019-04-12 NOTE — Progress Notes (Signed)
[facility] Initial Adult Exam  Name: Jonathan Rasmussen Date: 04/12/2019 MRN: [id number] DOB: 10-Oct-1970 PCP: Velna Hatchet, MD  Time spent: 53 minutes  Reason for Visit /Presenting Problem: He stated he feels like he "has let himself go". Does not exercise, not eating well, drinking to much, wants to stop smoking. He and his wife have an 50 year old. During the pandemic, it has been challenging to make sure he is getting his school work done. Coping w/ money stress.  He stated he knows he should take better care of myself, but he just has not done it. He was running about 3 miles/day about 3 years ago, pulled a ham string and did not return to it. Has a torn rotator cuff and knows he needs to see the doctor. Back when he was running, he ate healthier.  Marital stress is over money, he stated they live paycheck to Bailey. Feels he cannot express himself to his wife when concerned about money or with their son's grades which have fallen due to it being online school. His wife told him that she wants him to change his attitude; he stated may get upset about work, vent to his wife to then hear her tell him he takes things too personal.  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 He stated his boss/owner can be judgmental, this frustrates him; he made a comment about his shoes and also about how he plays guitar or has not been working too hard (he plays to ensure the instrument is sounding correct) He stated he needs a to see a doctor to to his health concerns. He has struggled to get tasks/chores done or walk the dogs enough.   Mental Status Exam:   Appearance:   Casual     Behavior:  Appropriate  Motor:  Normal  Speech/Language:   Clear and Coherent  Affect:  constricted  Mood:  depressed  Thought process:  normal  Thought content:    WNL  Sensory/Perceptual disturbances:    WNL  Orientation:  x4  Attention:  Good  Concentration:  Good  Memory:  WNL  Fund of knowledge:   Good   Insight:    Good  Judgment:   Good  Impulse Control:  Good   Reported Symptoms:  Depressed mood, anxiety (around money, his pain in shoulder, aching), lower motivation, lower concentration/focus, rumination (mainly at night)  Risk Assessment: Danger to Self:  No Self-injurious Behavior: No Danger to Others: No Duty to Warn:no Physical Aggression / Violence:No  Access to Firearms a concern: No  Gang Involvement:No  Patient / guardian was educated about steps to take if suicide or homicide risk level increases between visits: yes While future psychiatric events cannot be accurately predicted, the patient does not currently require acute inpatient psychiatric care and does not currently meet [facility] involuntary commitment criteria.  Medical History/Surgical History: No past medical history on file.  No past surgical history on file.  Medications: Current Outpatient Medications  Medication Sig Dispense Refill  . benzonatate (TESSALON) 100 MG capsule benzonatate 100 mg capsule    . dicyclomine (BENTYL) 20 MG tablet Take one every 6-8hours if needed for abdominal cramping 20 tablet 0  . Digestive Enzymes (DIGESTIVE ENZYME PO) Take 1 capsule by mouth once a week.    Marland Kitchen HYDROcodone-acetaminophen (NORCO/VICODIN) 5-325 MG tablet hydrocodone 5 mg-acetaminophen 325 mg tablet  1 - 2 tabs QHS.   may repeat 6 hrs later PRN    . ibuprofen (  ADVIL,MOTRIN) 200 MG tablet Take 200 mg by mouth every 6 (six) hours as needed for moderate pain.     . pantoprazole (PROTONIX) 20 MG tablet Take 1 tablet (20 mg total) by mouth daily. 30 tablet 0  . vitamin C (ASCORBIC ACID) 500 MG tablet Take 500 mg by mouth once a week.     No current facility-administered medications for this visit.    No Known Allergies  Diagnoses:    ICD-10-CM   1. Major depressive disorder, recurrent episode, moderate (HCC)  F33.1     Plan of Care: to be completed   Waldron Session, [facility]

## 2019-05-03 ENCOUNTER — Ambulatory Visit (INDEPENDENT_AMBULATORY_CARE_PROVIDER_SITE_OTHER): Payer: BC Managed Care – PPO | Admitting: Mental Health

## 2019-05-03 ENCOUNTER — Other Ambulatory Visit: Payer: Self-pay

## 2019-05-03 DIAGNOSIS — F331 Major depressive disorder, recurrent, moderate: Secondary | ICD-10-CM
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
## 2019-05-05 DIAGNOSIS — M25512 Pain in left shoulder: Secondary | ICD-10-CM | POA: Diagnosis not present

## 2019-05-05 DIAGNOSIS — M65819 Other synovitis and tenosynovitis, unspecified shoulder: Secondary | ICD-10-CM | POA: Diagnosis not present

## 2019-05-31 ENCOUNTER — Ambulatory Visit (INDEPENDENT_AMBULATORY_CARE_PROVIDER_SITE_OTHER): Payer: BC Managed Care – PPO | Admitting: Mental Health

## 2019-05-31 ENCOUNTER — Other Ambulatory Visit: Payer: Self-pay

## 2019-05-31 DIAGNOSIS — F331 Major depressive disorder, recurrent, moderate: Secondary | ICD-10-CM

## 2019-05-31 NOTE — Progress Notes (Signed)
[facility] psychotherapy note  Name: LISSANDRO DILORENZO Date: 05/31/2019 MRN: [id number] DOB: [birth year] PCP: Alysia Penna, MD  Time spent: 30 minutes  Treatment: Individual therapy  Mental Status Exam:   Appearance:   Casual     Behavior:  Appropriate  Motor:  Normal  Speech/Language:   Clear and Coherent  Affect:  constricted  Mood:  depressed, pleasant  Thought process:  normal  Thought content:    WNL  Sensory/Perceptual disturbances:    WNL  Orientation:  x4  Attention:  Good  Concentration:  Good  Memory:  WNL  Fund of knowledge:   Good  Insight:    Good  Judgment:   Good  Impulse Control:  Good   Reported Symptoms:  Depressed mood, anxiety (around money, his pain in shoulder, aching), lower motivation, lower concentration/focus, rumination (mainly at night)  Risk Assessment: Danger to Self:  No Self-injurious Behavior: No Danger to Others: No Duty to Warn:no Physical Aggression / Violence:No  Access to Firearms a concern: No  Gang Involvement:No  Patient / guardian was educated about steps to take if suicide or homicide risk level increases between visits: yes While future psychiatric events cannot be accurately predicted, the patient does not currently require acute inpatient psychiatric care and does not currently meet [facility] involuntary commitment criteria.  Medical History/Surgical History: No past medical history on file.  No past surgical history on file.  Medications: Current Outpatient Medications  Medication Sig Dispense Refill  . benzonatate (TESSALON) 100 MG capsule benzonatate 100 mg capsule    . dicyclomine (BENTYL) 20 MG tablet Take one every 6-8hours if needed for abdominal cramping 20 tablet 0  . Digestive Enzymes (DIGESTIVE ENZYME PO) Take 1 capsule by mouth once a week.    Marland Kitchen HYDROcodone-acetaminophen (NORCO/VICODIN) 5-325 MG tablet hydrocodone 5 mg-acetaminophen 325 mg tablet  1 - 2 tabs QHS.   may repeat 6 hrs later PRN     . ibuprofen (ADVIL,MOTRIN) 200 MG tablet Take 200 mg by mouth every 6 (six) hours as needed for moderate pain.     . pantoprazole (PROTONIX) 20 MG tablet Take 1 tablet (20 mg total) by mouth daily. 30 tablet 0  . vitamin C (ASCORBIC ACID) 500 MG tablet Take 500 mg by mouth once a week.     No current facility-administered medications for this visit.    Subjective:  Patient engaged in telehealth visit.  Has not drank for the past 2 weeks, feels better physically, exercising more. Plans to quit smoking soon. 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 He stated his wife is not happy with her job, she works as a Pharmacist, community. She is quitting to work for herself in Insurance risk surveyor, she decided this 2 days ago. He is anxious as this will affect them financially. Recently, started waking up at night ruminating. He feels he cannot show doubt or she will get upset, stating he may not be supportive. He tries to be supportive but when he tries to talk w/ her, she tells him he is not supportive.  Provide support as he processed these concerns and feelings associated.  Explored ways to communicate his feelings and concerns that are making him feel anxious.  He plans to follow through with having a discussion encouraging his wife to give herself time to make this important decision as she has decided just to get days ago.  Provide encouragement for her to him to continue abstinence from alcohol as he stated  he is feeling better and is experiencing benefits.  Interventions: CBT, supportive therapy, solution focused approaches   Diagnoses:    ICD-10-CM   1. Major depressive disorder, recurrent episode, moderate ([facility])  F33.1     Plan: Patient is to use CBT, mindfulness and coping skills to help manage decrease symptoms. Patient is to create a scheduled exercise regimen.   Long-term goal:  Reduce overall level, frequency, and intensity of the feelings of depression, anxiety and panic so that  daily functioning is not impaired.  Short-term goal: To identify and process feelings related to the disappointment of past painful events that increase worthless feelings.                   Verbally express understanding of the relationship between depressed mood and repression of feelings - such as anger, hurt, and sadness.                              Verbalize an understanding of the role that distorted thinking plays in creating fears, excessive worry, and ruminations.        Begin engaging in exercise to de-stress.  Assessment of progress:  progressing     Anson Oregon, [facility]

## 2019-06-01 ENCOUNTER — Ambulatory Visit: Payer: BC Managed Care – PPO | Admitting: Addiction (Substance Use Disorder)

## 2019-06-21 ENCOUNTER — Ambulatory Visit (INDEPENDENT_AMBULATORY_CARE_PROVIDER_SITE_OTHER): Payer: BC Managed Care – PPO | Admitting: Mental Health

## 2019-06-21 ENCOUNTER — Other Ambulatory Visit: Payer: Self-pay

## 2019-06-21 DIAGNOSIS — F331 Major depressive disorder, recurrent, moderate: Secondary | ICD-10-CM | POA: Diagnosis not present

## 2019-06-21 NOTE — Progress Notes (Signed)
[facility] psychotherapy note  Name: Jonathan Rasmussen Date: 06/21/2019 MRN: [id number] DOB: 09/30/1970 PCP: Velna Hatchet, MD  Time spent: 50 minutes  Treatment: Individual therapy  Mental Status Exam:   Appearance:   Casual     Behavior:  Appropriate  Motor:  Normal  Speech/Language:   Clear and Coherent  Affect:   Full range  Mood:     Euthymic  Thought process:  normal  Thought content:    WNL  Sensory/Perceptual disturbances:    WNL  Orientation:  x4  Attention:  Good  Concentration:  Good  Memory:  WNL  Fund of knowledge:   Good  Insight:    Good  Judgment:   Good  Impulse Control:  Good   Reported Symptoms:  Depressed mood, anxiety (around money, his pain in shoulder, aching), lower motivation, lower concentration/focus, rumination (mainly at night)  Risk Assessment: Danger to Self:  No Self-injurious Behavior: No Danger to Others: No Duty to Warn:no Physical Aggression / Violence:No  Access to Firearms a concern: No  Gang Involvement:No  Patient / guardian was educated about steps to take if suicide or homicide risk level increases between visits: yes While future psychiatric events cannot be accurately predicted, the patient does not currently require acute inpatient psychiatric care and does not currently meet [facility] involuntary commitment criteria.  Medical History/Surgical History: No past medical history on file.  No past surgical history on file.  Medications: Current Outpatient Medications  Medication Sig Dispense Refill  . benzonatate (TESSALON) 100 MG capsule benzonatate 100 mg capsule    . dicyclomine (BENTYL) 20 MG tablet Take one every 6-8hours if needed for abdominal cramping 20 tablet 0  . Digestive Enzymes (DIGESTIVE ENZYME PO) Take 1 capsule by mouth once a week.    Marland Kitchen HYDROcodone-acetaminophen (NORCO/VICODIN) 5-325 MG tablet hydrocodone 5 mg-acetaminophen 325 mg tablet  1 - 2 tabs QHS.   may repeat 6 hrs later PRN    .  ibuprofen (ADVIL,MOTRIN) 200 MG tablet Take 200 mg by mouth every 6 (six) hours as needed for moderate pain.     . pantoprazole (PROTONIX) 20 MG tablet Take 1 tablet (20 mg total) by mouth daily. 30 tablet 0  . vitamin C (ASCORBIC ACID) 500 MG tablet Take 500 mg by mouth once a week.     No current facility-administered medications for this visit.    Subjective:  Patient presents for session in no distress, sharing progress.  He stated that he continues to abstain from drinking, how is been about 6 weeks since he last had a drink.  Reports positive changes both emotionally and physically with increased energy and better sleep.  He shared how he follow through with discussion with his wife expressing concerns about her making a job change where he says she immediately got upset and criticize him for not being supportive.  He stated he was careful to frame his statements in a way that were focused around his feeling worried about their financial security, however, she did not hear him fully before she got upset.  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  He stated he has taken steps to care for himself by walking at least 2 miles a day in the morning with his dogs and this is been beneficial.  He shared more history relating to his family as well as his wife's family history, and how they met  and lived together for about 10 years before getting married.  He identified ways he is making attempts to communicate in being more assertive with his communication and expressing his needs.  He did so recently as he has 1 day off from work and needed some independent downtime, he stated he was able to communicate with his wife on this day as she was gone from home for several hours unexpectedly, this is upsetting him somewhat as this being his only day off to have this downtime. He reports his anxiety has lowered recently,  continues to persist at times but has some decreased rumination.  Encouraged him to continue to take the steps toward self-care that he has taken thus far as well as examined relational dynamic he has in his marriage and subsequent communication.  Interventions: CBT, supportive therapy, solution focused approaches   Diagnoses:    ICD-10-CM   1. Major depressive disorder, recurrent episode, moderate ([place])  F33.1     Plan: Patient is to use CBT, mindfulness and coping skills to help manage decrease symptoms. Patient is to create a scheduled exercise regimen.   Long-term goal:  Reduce overall level, frequency, and intensity of the feelings of depression, anxiety and panic so that daily functioning is not impaired.  Short-term goal: To identify and process feelings related to the disappointment of past painful events that increase worthless feelings.                   Verbally express understanding of the relationship between depressed mood and repression of feelings - such as anger, hurt, and sadness.                              Verbalize an understanding of the role that distorted thinking plays in creating fears, excessive worry, and ruminations.        Begin engaging in exercise to de-stress.  Assessment of progress:  progressing     Anson [place], [facility]

## 2019-07-12 ENCOUNTER — Ambulatory Visit: Payer: BC Managed Care – PPO | Admitting: Mental Health

## 2020-11-11 DIAGNOSIS — M5430 Sciatica, unspecified side: Secondary | ICD-10-CM | POA: Insufficient documentation

## 2020-12-26 ENCOUNTER — Other Ambulatory Visit: Payer: Self-pay

## 2020-12-26 ENCOUNTER — Encounter (HOSPITAL_COMMUNITY): Payer: Self-pay | Admitting: Emergency Medicine

## 2020-12-26 ENCOUNTER — Ambulatory Visit (HOSPITAL_COMMUNITY)
Admission: EM | Admit: 2020-12-26 | Discharge: 2020-12-26 | Disposition: A | Discharge status: Home / Self Care | Payer: Self-pay | Attending: Family Medicine | Admitting: Family Medicine

## 2020-12-26 ENCOUNTER — Ambulatory Visit (INDEPENDENT_AMBULATORY_CARE_PROVIDER_SITE_OTHER): Payer: Self-pay

## 2020-12-26 ENCOUNTER — Ambulatory Visit
Admission: EM | Admit: 2020-12-26 | Discharge: 2020-12-26 | Disposition: A | Discharge status: Other Acute Inpatient Hospital | Payer: BLUE CROSS/BLUE SHIELD

## 2020-12-26 DIAGNOSIS — W19XXXA Unspecified fall, initial encounter: Secondary | ICD-10-CM

## 2020-12-26 DIAGNOSIS — M25552 Pain in left hip: Secondary | ICD-10-CM

## 2020-12-26 DIAGNOSIS — M79605 Pain in left leg: Secondary | ICD-10-CM

## 2020-12-26 MED ORDER — DEXAMETHASONE SODIUM PHOSPHATE 10 MG/ML IJ SOLN
10.0000 mg | Freq: Once | INTRAMUSCULAR | Status: AC
  Administered 2020-12-26: 10 mg via INTRAMUSCULAR

## 2020-12-26 NOTE — ED Triage Notes (Signed)
Pt is present today with pain starting at his left hip radiating down to his toes. Pt states that's he had a fall one month ago and landed on his left side. Pt states that he his pain has intensified over the last week.

## 2020-12-26 NOTE — ED Provider Notes (Signed)
[facility]    CSN: [id number] Arrival date & time: 12/26/20  0908      History   Chief Complaint Chief Complaint  Patient presents with   Hip Pain    HPI COHAN STIPES is a 50 y.o. male.   Presenting today with about a month and a half of waxing and waning left hip pain radiating down left lateral leg moving anteriorly across knee to medial anterior leg.  States he has had issues off and on for years with this leg, has torn his hamstring on the side and has had multiple other injuries but he states after falling onto the hip and buttock about a month and a half ago his pain has been worse and more constant.  He is able to walk and have full range of motion but laterally, numbness, tingling, leg now.  Denies bowel or bladder incontinence, saddle anesthesia, fevers, chills, known back injuries, decreased range of motion.  Has been trying over-the-counter pain relievers, stretches, dry needling with minimal relief.   History reviewed. No pertinent past medical history.  Patient Active Problem List   Diagnosis Date Noted   Acute pain of left shoulder 06/17/2016    History reviewed. No pertinent surgical history.     Home Medications    Prior to Admission medications   Medication Sig Start Date End Date Taking? Authorizing Provider  benzonatate (TESSALON) 100 MG capsule benzonatate 100 mg capsule    [provider]  dicyclomine (BENTYL) 20 MG tablet Take one every 6-8hours if needed for abdominal cramping 07/20/15   Bethann Berkshire, MD  Digestive Enzymes (DIGESTIVE ENZYME PO) Take 1 capsule by mouth once a week.    [provider]  HYDROcodone-acetaminophen (NORCO/VICODIN) 5-325 MG tablet hydrocodone 5 mg-acetaminophen 325 mg tablet  1 - 2 tabs QHS.   may repeat 6 hrs later PRN    [provider]  ibuprofen (ADVIL,MOTRIN) 200 MG tablet Take 200 mg by mouth every 6 (six) hours as needed for moderate pain.     [provider]   pantoprazole (PROTONIX) 20 MG tablet Take 1 tablet (20 mg total) by mouth daily. 07/20/15   Bethann Berkshire, MD  vitamin C (ASCORBIC ACID) 500 MG tablet Take 500 mg by mouth once a week.    [provider]    Family History History reviewed. No pertinent family history.  Social History Social History   Tobacco Use   Smoking status: Never   Smokeless tobacco: Never  Substance Use Topics   Alcohol use: Yes   Drug use: Yes    Types: Marijuana     Allergies   Patient has no known allergies.   Review of Systems Review of Systems Per HPI  Physical Exam Triage Vital Signs ED Triage Vitals  Enc Vitals Group     BP 12/26/20 1146 (!) 146/96     Pulse Rate 12/26/20 1146 70     Resp 12/26/20 1146 17     Temp 12/26/20 1147 98.2 F (36.8 C)     Temp Source 12/26/20 1146 Oral     SpO2 12/26/20 1146 97 %     Weight --      Height --      Head Circumference --      Peak Flow --      Pain Score 12/26/20 1144 8     Pain Loc --      Pain Edu? --      Excl. in GC? --  No data found.  Updated Vital Signs BP (!) 146/96 (BP Location: Right Arm)   Pulse 70   Temp 98.2 F (36.8 C) (Oral)   Resp 17   SpO2 97%   Visual Acuity Right Eye Distance:   Left Eye Distance:   Bilateral Distance:    Right Eye Near:   Left Eye Near:    Bilateral Near:     Physical Exam Vitals and nursing note reviewed.  Constitutional:      Appearance: Normal appearance.  HENT:     Head: Atraumatic.  Eyes:     Extraocular Movements: Extraocular movements intact.     Conjunctiva/sclera: Conjunctivae normal.  Cardiovascular:     Rate and Rhythm: Normal rate and regular rhythm.  Pulmonary:     Effort: Pulmonary effort is normal.     Breath sounds: Normal breath sounds.  Musculoskeletal:        General: Tenderness and signs of injury present. No swelling or deformity. Normal range of motion.     Cervical back: Normal range of motion and neck supple.     Comments: Mild tenderness  to palpation left lateral hip and down left IT band.  Range of motion full and intact left hip.  No midline spinal tenderness to palpation diffusely.  Negative straight leg raise bilaterally  Skin:    General: Skin is warm and dry.     Findings: No bruising or erythema.  Neurological:     General: No focal deficit present.     Mental Status: He is oriented to person, place, and time.     Comments: Bilateral lower extremities neurovascularly intact  Psychiatric:        Mood and Affect: Mood normal.        Thought Content: Thought content normal.        Judgment: Judgment normal.     UC Treatments / Results  Labs (all labs ordered are listed, but only abnormal results are displayed) Labs Reviewed - No data to display  EKG   Radiology DG Hip Unilat With Pelvis 2-3 Views Left  Result Date: 12/26/2020 CLINICAL DATA:  Fall 1 month ago.  Left hip pain. EXAM: DG HIP (WITH OR WITHOUT PELVIS) 2-3V LEFT COMPARISON:  07/20/2015 FINDINGS: There is no evidence of hip fracture or dislocation. There is no evidence of arthropathy or other focal bone abnormality. IMPRESSION: Negative. Electronically Signed   By: Signa Kell M.D.   On: 12/26/2020 12:39    Procedures Procedures (including critical care time)  Medications Ordered in UC Medications  dexamethasone (DECADRON) injection 10 mg (10 mg Intramuscular Given 12/26/20 1258)    Initial Impression / Assessment and Plan / UC Course  I have reviewed the triage vital signs and the nursing notes.  Pertinent labs & imaging results that were available during my care of the patient were reviewed by me and considered in my medical decision making (see chart for details).     Exam overall reassuring, left hip x-ray negative for bony abnormality.  Suspect IT band and other soft tissue inflammation.  We will treat with IM Decadron, Flexeril which he states he already has at home, continued stretches and sports med follow-up.  Return for worsening  symptoms.  Final Clinical Impressions(s) / UC Diagnoses   Final diagnoses:  Left hip pain  Left leg pain   Discharge Instructions   None    ED Prescriptions   None    PDMP not reviewed this encounter.   Particia Nearing,  PA-C 12/26/20 1300

## 2021-01-03 ENCOUNTER — Encounter (HOSPITAL_COMMUNITY): Payer: Self-pay | Admitting: Emergency Medicine

## 2021-01-03 ENCOUNTER — Emergency Department (HOSPITAL_COMMUNITY)
Admission: EM | Admit: 2021-01-03 | Discharge: 2021-01-03 | Disposition: A | Discharge status: Home / Self Care | Payer: Self-pay | Attending: Emergency Medicine | Admitting: Emergency Medicine

## 2021-01-03 DIAGNOSIS — M5432 Sciatica, left side: Secondary | ICD-10-CM

## 2021-01-03 DIAGNOSIS — R202 Paresthesia of skin: Secondary | ICD-10-CM | POA: Insufficient documentation

## 2021-01-03 MED ORDER — PREDNISONE 20 MG PO TABS: ORAL_TABLET | ORAL | 0 refills | Status: DC

## 2021-01-03 MED ORDER — KETOROLAC TROMETHAMINE 30 MG/ML IJ SOLN
30.0000 mg | Freq: Once | INTRAMUSCULAR | Status: AC
  Administered 2021-01-03: 30 mg via INTRAMUSCULAR
  Filled 2021-01-03: qty 1

## 2021-01-03 NOTE — ED Provider Notes (Signed)
[facility]EMERGENCY [facility]PT Provider Note   CSN: [id number] Arrival date & time: 01/03/21  7001     History Chief Complaint  Patient presents with   Hip Pain    Jonathan Rasmussen is a 50 y.o. male.  HPI 50 year old male presents with left hip pain radiating down his leg.  This been ongoing since last month.  He does not remember any specific trauma.  Occasionally he has numbness, especially after doing certain activities.  No focal weakness though sometimes he has some difficulty with flexing his left foot.  He denies bowel/bladder incontinence.  No fevers.  Was seen at urgent care and given Flexeril which she states he takes at night as he cannot take with his job.  He is also been taking NSAIDs without relief.  He was given an IM Decadron shot and feels like it temporarily helped for about 1 day.  He briefly did physical therapy.  At 1 point he got "dry needling" and states he think this might made a little worse but he has been intermittently trying the exercises given.  History reviewed. No pertinent past medical history.  Patient Active Problem List   Diagnosis Date Noted   Acute pain of left shoulder 06/17/2016    History reviewed. No pertinent surgical history.     No family history on file.  Social History   Tobacco Use   Smoking status: Never   Smokeless tobacco: Never  Substance Use Topics   Alcohol use: Yes   Drug use: Yes    Types: Marijuana    Home Medications Prior to Admission medications   Medication Sig Start Date End Date Taking? Authorizing Provider  predniSONE (DELTASONE) 20 MG tablet 3 tabs po daily x 3 days, then 2 tabs x 3 days, then 1.5 tabs x 3 days, then 1 tab x 3 days, then 0.5 tabs x 3 days 01/03/21  Yes Pricilla Loveless, MD  benzonatate (TESSALON) 100 MG capsule benzonatate 100 mg capsule    [provider]  dicyclomine (BENTYL) 20 MG tablet Take one every 6-8hours if needed for abdominal cramping 07/20/15   Bethann Berkshire, MD  Digestive Enzymes (DIGESTIVE ENZYME PO) Take 1 capsule by mouth once a week.    [provider]  HYDROcodone-acetaminophen (NORCO/VICODIN) 5-325 MG tablet hydrocodone 5 mg-acetaminophen 325 mg tablet  1 - 2 tabs QHS.   may repeat 6 hrs later PRN    [provider]  ibuprofen (ADVIL,MOTRIN) 200 MG tablet Take 200 mg by mouth every 6 (six) hours as needed for moderate pain.     [provider]  pantoprazole (PROTONIX) 20 MG tablet Take 1 tablet (20 mg total) by mouth daily. 07/20/15   Bethann Berkshire, MD  vitamin C (ASCORBIC ACID) 500 MG tablet Take 500 mg by mouth once a week.    [provider]    Allergies    Patient has no known allergies.  Review of Systems   Review of Systems  Constitutional:  Negative for fever.  Genitourinary:        No incontinence  Musculoskeletal:  Negative for back pain.  Neurological:  Positive for numbness. Negative for weakness.  All other systems reviewed and are negative.  Physical Exam Updated Vital Signs BP 123/87 (BP Location: Right Arm)   Pulse 84   Temp 98.3 F (36.8 C) (Oral)   Resp 17   SpO2 100%   Physical Exam Vitals and nursing note reviewed.  Constitutional:  Appearance: He is well-developed.  HENT:     Head: Normocephalic and atraumatic.     Right Ear: External ear normal.     Left Ear: External ear normal.     Nose: Nose normal.  Eyes:     General:        Right eye: No discharge.        Left eye: No discharge.  Cardiovascular:     Rate and Rhythm: Normal rate and regular rhythm.  Pulmonary:     Effort: Pulmonary effort is normal.  Abdominal:     General: There is no distension.  Musculoskeletal:     Lumbar back: No tenderness.     Left hip: No tenderness. Normal range of motion.  Skin:    General: Skin is warm and dry.  Neurological:     Mental Status: He is alert.     Deep Tendon Reflexes:     Reflex Scores:      Patellar reflexes are 2+ on the right side and 2+ on  the left side.      Achilles reflexes are 2+ on the right side and 2+ on the left side.    Comments: 5/5 strength in BLE. Grossly normal sensation. He has mild difficulty dorsi-flexing left foot, but he is able to do it and indicates it is a little limited by pain  Psychiatric:        Mood and Affect: Mood is not anxious.    ED Results / Procedures / Treatments   Labs (all labs ordered are listed, but only abnormal results are displayed) Labs Reviewed - No data to display  EKG None  Radiology No results found.  Procedures Procedures   Medications Ordered in ED Medications  ketorolac (TORADOL) 30 MG/ML injection 30 mg (has no administration in time range)    ED Course  I have reviewed the triage vital signs and the nursing notes.  Pertinent labs & imaging results that were available during my care of the patient were reviewed by me and considered in my medical decision making (see chart for details).    MDM Rules/Calculators/A&P                           Patient does not have any emergent findings to suggest spinal cord emergency.  He is well-appearing.  Normal reflexes.  I do not think emergent MRI is needed but that is what he was hoping for.  Otherwise he will need to follow-up with a PCP to get outpatient work-up.  Since the Decadron seem to temporarily help we will give a steroid taper.  We will give IM Toradol here.  We discussed return precautions. Final Clinical Impression(s) / ED Diagnoses Final diagnoses:  Left sided sciatica    Rx / DC Orders ED Discharge Orders          Ordered    predniSONE (DELTASONE) 20 MG tablet        01/03/21 1001             Pricilla Loveless, MD 01/03/21 1010

## 2021-01-03 NOTE — Discharge Instructions (Addendum)
If you develop worsening, recurrent, or continued back pain, numbness or weakness in the legs, incontinence of your bowels or bladders, numbness of your buttocks, fever, abdominal pain, or any other new/concerning symptoms then return to the ER for evaluation.  

## 2021-01-03 NOTE — ED Triage Notes (Signed)
Per patient, states left hip pain radiating sown left leg-no injury or trauma

## 2021-01-05 ENCOUNTER — Encounter: Payer: Self-pay | Admitting: Internal Medicine

## 2021-01-05 ENCOUNTER — Ambulatory Visit: Payer: Self-pay | Attending: Internal Medicine | Admitting: Internal Medicine

## 2021-01-05 ENCOUNTER — Other Ambulatory Visit: Payer: Self-pay

## 2021-01-05 VITALS — BP 117/76 | HR 82 | Ht 70.0 in | Wt 152.3 lb

## 2021-01-05 DIAGNOSIS — M5416 Radiculopathy, lumbar region: Secondary | ICD-10-CM

## 2021-01-05 MED ORDER — TRAMADOL HCL 50 MG PO TABS: 50.0000 mg | ORAL_TABLET | Freq: Four times a day (QID) | ORAL | 0 refills | Status: DC | PRN

## 2021-01-05 MED ORDER — TRAMADOL HCL 50 MG PO TABS
50.0000 mg | ORAL_TABLET | Freq: Four times a day (QID) | ORAL | 0 refills | Status: DC
  Filled 2021-01-05: qty 40, 10d supply, fill #0

## 2021-01-05 NOTE — Progress Notes (Signed)
Patient ID: Jonathan Rasmussen, male    DOB: 31-Jan-1970  MRN: [id number]  CC: Hospitalization Follow-up   Subjective: Jonathan Rasmussen is a 50 y.o. male who presents for new patient visit and ER follow-up.  His wife is with him. His concerns today include:   Previous PCP was Dr. Link Snuffer.  He last saw him about 2 years ago.  He is without insurance at this time.  He denies any chronic medical issues and is not on any chronic medications.  Main complaint today is pain and numbness in the left leg that started about 2 months ago and has gotten progressively worse.  Feels like the pain starts in the left lower back just above the buttock.  Worse with walking but also pain at times with walking and sitting. -The pain and numbness radiates down the lateral thigh and crosses over the leg to the medial calf and medial aspect of the foot.  Sometimes the pain shoots into the left groin.  No saddle anesthesia.  No incontinence of bowel or bladder. Pain intensity varies.  He has been using Advil, meloxicam, and is currently on a prednisone taper that was given to him from the emergency room 2 days ago.  He is currently on 60 mg of the prednisone and feels that it has not made a significant difference so far. -He is not sure of initiating factors but recalls several significant events in the past 2 months.  One was that he helped his son build a dome.  This required him to do a lot of standing where he was reaching above his head holding bars in place for prolong periods.  This project went on for a month.  He also recalls falling off his skateboard and landing on his left side around mid October.  Symptoms started about 2 days thereafter.  He first noticed tightness and numbness in the leg when walking his dog.  He usually walks about 3 miles most days.  He has tripped and fallen several times since his symptoms began.  He noted difficulty with raising/dorsi-flexing the left foot.  -Seen in urgent care  12/26/2020.  He was given a steroid shot.  Subsequently seen in the emergency room 01/03/2021  Patient Active Problem List   Diagnosis Date Noted   Acute pain of left shoulder 06/17/2016     Current Outpatient Medications on File Prior to Visit  Medication Sig Dispense Refill   predniSONE (DELTASONE) 20 MG tablet 3 tabs po daily x 3 days, then 2 tabs x 3 days, then 1.5 tabs x 3 days, then 1 tab x 3 days, then 0.5 tabs x 3 days 27 tablet 0   benzonatate (TESSALON) 100 MG capsule benzonatate 100 mg capsule (Patient not taking: Reported on 01/05/2021)     dicyclomine (BENTYL) 20 MG tablet Take one every 6-8hours if needed for abdominal cramping (Patient not taking: Reported on 01/05/2021) 20 tablet 0   Digestive Enzymes (DIGESTIVE ENZYME PO) Take 1 capsule by mouth once a week. (Patient not taking: Reported on 01/05/2021)     HYDROcodone-acetaminophen (NORCO/VICODIN) 5-325 MG tablet hydrocodone 5 mg-acetaminophen 325 mg tablet  1 - 2 tabs QHS.   may repeat 6 hrs later PRN (Patient not taking: Reported on 01/05/2021)     ibuprofen (ADVIL,MOTRIN) 200 MG tablet Take 200 mg by mouth every 6 (six) hours as needed for moderate pain.  (Patient not taking: Reported on 01/05/2021)     pantoprazole (PROTONIX) 20 MG tablet Take 1  tablet (20 mg total) by mouth daily. (Patient not taking: Reported on 01/05/2021) 30 tablet 0   vitamin C (ASCORBIC ACID) 500 MG tablet Take 500 mg by mouth once a week. (Patient not taking: Reported on 01/05/2021)     No current facility-administered medications on file prior to visit.    No Known Allergies  Social History   Socioeconomic History   Marital status: Single    Spouse name: Not on file   Number of children: Not on file   Years of education: Not on file   Highest education level: Not on file  Occupational History   Not on file  Tobacco Use   Smoking status: Never   Smokeless tobacco: Never  Substance and Sexual Activity   Alcohol use: Yes   Drug use: Yes     Types: Marijuana   Sexual activity: Not on file  Other Topics Concern   Not on file  Social History Narrative   Not on file   Social Determinants of Health   Financial Resource Strain: Not on file  Food Insecurity: Not on file  Transportation Needs: Not on file  Physical Activity: Not on file  Stress: Not on file  Social Connections: Not on file  Intimate Partner Violence: Not on file    No family history on file.  No past surgical history on file.  ROS: Review of Systems Negative except as stated above  PHYSICAL EXAM: BP 117/76   Pulse 82   Ht 5\' 10"  (1.778 m)   Wt 152 lb 4.8 oz (69.1 kg)   SpO2 99%   BMI 21.85 kg/m   Physical Exam   General appearance - alert, well appearing, middle-aged Caucasian male and in no distress Mental status - normal mood, behavior, speech, dress, motor activity, and thought processes Neurological -power in both lower extremities proximally and distally 5/5 bilaterally with some spasticity.  Power in the left foot with dorsiflexion is 2/5 and the same for plantar flexion.  On the right foot power 5/5.  Knee and ankle jerk reflexes are brisk.  Straight leg raise negative except it produces some pain in the lower back on the left side.  Gait normal except he has minimal foot to floor clearance with the left foot.   Musculoskeletal -mild tenderness on palpation of the lumbar spine and surrounding paraspinal muscles.  CMP Latest Ref Rng & Units 07/20/2015  Glucose 65 - 99 mg/dL 07/22/2015)  BUN 6 - 20 mg/dL 631(S)  Creatinine 97(W - 1.24 mg/dL 2.63  Sodium 7.85 - 885 mmol/L 135  Potassium 3.5 - 5.1 mmol/L 3.8  Chloride 101 - 111 mmol/L 105  CO2 22 - 32 mmol/L 21(L)  Calcium 8.9 - 10.3 mg/dL 9.2  Total Protein 6.5 - 8.1 g/dL 6.7  Total Bilirubin 0.3 - 1.2 mg/dL 0.7  Alkaline Phos 38 - 126 U/L 53  AST 15 - 41 U/L 25  ALT 17 - 63 U/L 19   Lipid Panel  No results found for: CHOL, TRIG, HDL, CHOLHDL, VLDL, LDLCALC, LDLDIRECT  CBC    Component  Value Date/Time   WBC 10.9 (H) 07/20/2015 0650   WBC DUPLICATE REQUEST 07/20/2015 0650   RBC 4.59 07/20/2015 0650   RBC DUPLICATE REQUEST 07/20/2015 0650   HGB 14.5 07/20/2015 0650   HGB DUPLICATE REQUEST 07/20/2015 0650   HCT 42.6 07/20/2015 0650   HCT DUPLICATE REQUEST 07/20/2015 0650   PLT 249 07/20/2015 0650   PLT DUPLICATE REQUEST 07/20/2015 0650   MCV 92.8  07/20/2015 0650   MCV DUPLICATE REQUEST 07/20/2015 0650   MCH 31.6 07/20/2015 0650   MCH DUPLICATE REQUEST 07/20/2015 0650   MCHC 34.0 07/20/2015 0650   MCHC DUPLICATE REQUEST 07/20/2015 0650   RDW 12.9 07/20/2015 0650   RDW DUPLICATE REQUEST 07/20/2015 0650   LYMPHSABS 1.2 07/20/2015 0650   MONOABS 0.3 07/20/2015 0650   EOSABS 0.1 07/20/2015 0650   BASOSABS 0.1 07/20/2015 0650    ASSESSMENT AND PLAN: 1. Lumbar radiculopathy Patient with lumbar radiculopathy symptoms. I advise no lifting, pushing or pulling or excessive bending for the next several weeks.  We will move forward with getting an MRI.  I informed him how to apply for the [facility] discount since he does not have insurance.  However he wants to get the MRI even though he does not have insurance or the [facility] discount but will apply for the [facility] discount as we likely will have to refer him to a specialist based on the results.  In the meantime I have put him on some tramadol to use as needed.  Advised that the medication can cause drowsiness and not to take it when he has to drive or operate any machinery.  Advised that the medication is a narcotic medicine and can become habit-forming.  [place] controlled substance reporting system reviewed and is appropriate. - MR Lumbar Spine Wo Contrast; Future - traMADol (ULTRAM) 50 MG tablet; Take 1 tablet (50 mg total) by mouth every 6 (six) hours as needed.  Dispense: 40 tablet; Refill: 0    Patient was given the opportunity to ask questions.  Patient verbalized understanding of the plan and was able to repeat key elements  of the plan.   No orders of the defined types were placed in this encounter.    Requested Prescriptions    No prescriptions requested or ordered in this encounter    No follow-ups on file.  Jonah Blue, MD, FACP

## 2021-01-08 ENCOUNTER — Telehealth: Payer: Self-pay | Admitting: Internal Medicine

## 2021-01-08 MED ORDER — METHOCARBAMOL 500 MG PO TABS
500.0000 mg | ORAL_TABLET | Freq: Two times a day (BID) | ORAL | 1 refills | Status: DC | PRN
  Filled 2021-01-08: qty 30, 15d supply, fill #0
  Filled 2021-01-24: qty 30, 15d supply, fill #1

## 2021-01-08 NOTE — Telephone Encounter (Signed)
  Pt requesting rx for muscle relaxant to be sent to [facility] pharmacy. Will route to Dr. Laural Benes.

## 2021-01-08 NOTE — Telephone Encounter (Signed)
Pt is calling because he had an appt with Dr. Laural Benes Friday 12/[phone]//[phone]. Dr. Laural Benes and pt discussed a muscele releaxer but one was not sent to the pharmacy. Pt is asking if th muscel relaxer could be sent to the pharmacy please? Cb- [phone] Preferred Pharmacy- [facility].

## 2021-01-08 NOTE — Addendum Note (Signed)
Addended by: Jonah Blue B on: 01/08/2021 09:40 PM   Modules accepted: Orders

## 2021-01-09 ENCOUNTER — Other Ambulatory Visit: Payer: Self-pay

## 2021-01-09 NOTE — Telephone Encounter (Signed)
Contacted pt and made aware  

## 2021-01-15 ENCOUNTER — Other Ambulatory Visit: Payer: Self-pay | Admitting: Internal Medicine

## 2021-01-15 ENCOUNTER — Other Ambulatory Visit: Payer: Self-pay

## 2021-01-15 MED ORDER — TRAMADOL HCL 50 MG PO TABS
50.0000 mg | ORAL_TABLET | Freq: Four times a day (QID) | ORAL | 0 refills | Status: DC
  Filled 2021-01-15: qty 60, 15d supply, fill #0

## 2021-01-16 ENCOUNTER — Other Ambulatory Visit: Payer: Self-pay

## 2021-01-19 ENCOUNTER — Other Ambulatory Visit: Payer: Self-pay

## 2021-01-19 ENCOUNTER — Ambulatory Visit (HOSPITAL_COMMUNITY)
Admission: RE | Admit: 2021-01-19 | Discharge: 2021-01-19 | Disposition: A | Discharge status: Home / Self Care | Payer: Self-pay | Source: Ambulatory Visit | Attending: Internal Medicine | Admitting: Internal Medicine

## 2021-01-19 DIAGNOSIS — M5416 Radiculopathy, lumbar region: Secondary | ICD-10-CM | POA: Insufficient documentation

## 2021-01-23 ENCOUNTER — Other Ambulatory Visit: Payer: Self-pay

## 2021-01-23 ENCOUNTER — Telehealth: Payer: Self-pay | Admitting: Internal Medicine

## 2021-01-23 MED ORDER — MELOXICAM 15 MG PO TABS
15.0000 mg | ORAL_TABLET | Freq: Every day | ORAL | 1 refills | Status: DC
  Filled 2021-01-23: qty 30, 30d supply, fill #0

## 2021-01-23 NOTE — Telephone Encounter (Signed)
Phone call placed to patient today to discuss results of the MRI of the lumbar spine.  Patient informed that the MRI showed some degenerative arthritis changes, degenerative disc and herniated disc at the L4-L5 level.  The herniation more likely corresponds with the symptoms that he is currently having.  Patient states that he is still limited in his ability to walk due to his symptoms.  I told him that the neck step would be referral to a back surgeon.  He is currently uninsured.  We discussed referring him now and him making payment arrangements with the specialist versus applying for the [facility] card[facility] discount card.  I had told him how to apply for the [facility] discount card on last visit.  Patient states he has not done so as yet but we will move forward in doing that so he can get assistance with seeing the specialist.  I told him that once he is approved he should let me know so that I can submit the referral. Patient had question about whether he could get better without possibility of having surgery.  I told him that this would best be discussed with a spine specialist.  He expressed understanding.  He had question about whether adding an anti-inflammatory to the tramadol would help more given that there were some arthritis changes.  He mentioned meloxicam in particular.  I told him that we can indeed prescribe meloxicam for him to use with the tramadol.  Patient expressed understanding of the plan.  All questions were answered.  He will get back to me once he is approved for the [facility] card/[facility] discount so that I can submit the referral to Dr. Ophelia Charter.

## 2021-01-24 ENCOUNTER — Telehealth: Payer: Self-pay | Admitting: Internal Medicine

## 2021-01-24 ENCOUNTER — Other Ambulatory Visit: Payer: Self-pay | Admitting: Internal Medicine

## 2021-01-24 ENCOUNTER — Other Ambulatory Visit: Payer: Self-pay

## 2021-01-24 DIAGNOSIS — M5416 Radiculopathy, lumbar region: Secondary | ICD-10-CM

## 2021-01-24 NOTE — Telephone Encounter (Signed)
Last prescribed 01/15/21. Will forward to provider

## 2021-01-24 NOTE — Telephone Encounter (Signed)
Will forward to provider  

## 2021-01-24 NOTE — Telephone Encounter (Signed)
Copied from CRM [id number]. Topic: Referral - Request for Referral >> Jan 24, 2021 11:40 AM Marylen Ponto wrote: Has patient seen PCP for this complaint? Yes.   *If NO, is insurance requiring patient see PCP for this issue before PCP can refer them? Referral for which specialty: back surgeon Preferred provider/office: no specific provider or location Reason for referral: back pain with MRI results

## 2021-01-25 ENCOUNTER — Other Ambulatory Visit: Payer: Self-pay

## 2021-01-25 NOTE — Telephone Encounter (Signed)
Phone call placed to patient today regarding his request for tramadol.  Patient is not due for refill until the fifth of next month.  He tells me that he had taken to at one time and felt that it worked better in terms of pain relief than 1 tablet.  He also reports that the meloxicam has helped in conjunction with the tramadol.  Advised patient that I am unable to fill this prescription until the fifth of next month when he will be due again. I also called to inquire about his request 2 days ago for me to go ahead and submit the referral to the spine specialist.  On our last conversation we had said that he would wait until he is approved for the [facility] card/[facility] discount card.  However patient states that he brought in his information but appointment with the financial person is not until next Friday.  He felt he could not wait so he wanted the appointment.  He is scheduled to see them tomorrow.  I had referred him to Dr. Ophelia Charter.

## 2021-01-26 ENCOUNTER — Ambulatory Visit: Payer: Self-pay | Admitting: Physician Assistant

## 2021-01-29 ENCOUNTER — Other Ambulatory Visit: Payer: Self-pay

## 2021-01-30 ENCOUNTER — Ambulatory Visit (INDEPENDENT_AMBULATORY_CARE_PROVIDER_SITE_OTHER): Payer: Self-pay | Admitting: Orthopaedic Surgery

## 2021-01-30 ENCOUNTER — Ambulatory Visit (INDEPENDENT_AMBULATORY_CARE_PROVIDER_SITE_OTHER): Payer: Self-pay

## 2021-01-30 ENCOUNTER — Encounter: Payer: Self-pay | Admitting: Orthopaedic Surgery

## 2021-01-30 ENCOUNTER — Other Ambulatory Visit: Payer: Self-pay

## 2021-01-30 VITALS — BP 118/78 | Ht 70.0 in | Wt 150.0 lb

## 2021-01-30 DIAGNOSIS — M545 Low back pain, unspecified: Secondary | ICD-10-CM

## 2021-01-30 NOTE — Progress Notes (Signed)
Office Visit Note   Patient: Jonathan Rasmussen           Date of Birth: 1970-04-13           MRN: [id number] Visit Date: 01/30/2021              Requested by: Marcine Matar, MD [address] [place],  [place] [address] PCP: Marcine Matar, MD   Assessment & Plan: Visit Diagnoses:  1. Acute bilateral low back pain, unspecified whether sciatica present     Plan: Patient finishing his second prednisone Dosepak.  We discussed options which she can call if he like to proceed including epidural steroid injection.  We reviewed his MRI scan which shows multilevel disc degeneration.  He has a disc herniation caudad downturn on the left at L4-5 consistent with the symptoms.  Some disc bulge at L5-S1 not as prominent as L4-5.  We discussed activity modification at work avoiding repetitive turning twisting bending type activities since he has multilevel involvement on MRI scan as well as family history of disc problems.  Patient is thin we discussed smoking cessation.  He can call if he decides to proceed with epidural.  He is looking into some insurance options currently.  Follow-Up Instructions: Return in about 1 month (around 03/02/2021).   Orders:  Orders Placed This Encounter  Procedures   XR Lumbar Spine 2-3 Views   No orders of the defined types were placed in this encounter.     Procedures: No procedures performed   Clinical Data: No additional findings.   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  Patient states he was volunteering but did a lot of turning twisting lifting activities.  He normally fixes string bass doing repairs and has not been able to do this job since he has pain with lifting.  He felt sharp pains arrayed down his leg and noticed weakness  with ankle dorsiflexion.  Has a father has had lumbar disc problems which is required repetitive treatment.  He has been on muscle relaxants had a few tramadol also taken meloxicam.  Review of Systems past history of shoulder problems not current.  Plus for lumbar disc degeneration.   Objective: Vital Signs: BP 118/78    Ht 5\' 10"  (1.778 m)    Wt 150 lb (68 kg)    BMI 21.52 kg/m   Physical Exam Constitutional:      Appearance: He is well-developed.  HENT:     Head: Normocephalic and atraumatic.     Right Ear: External ear normal.     Left Ear: External ear normal.  Eyes:     Pupils: Pupils are equal, round, and reactive to light.  Neck:     Thyroid: No thyromegaly.     Trachea: No tracheal deviation.  Cardiovascular:     Rate and Rhythm: Normal rate.  Pulmonary:     Effort: Pulmonary effort is normal.     Breath sounds: No wheezing.  Abdominal:     General: Bowel sounds are normal.     Palpations: Abdomen is soft.  Musculoskeletal:     Cervical back: Neck supple.  Skin:    General: Skin is warm and dry.     Capillary Refill: Capillary  refill takes less than 2 seconds.  Neurological:     Mental Status: He is alert and oriented to person, place, and time.  Psychiatric:        Behavior: Behavior normal.        Thought Content: Thought content normal.        Judgment: Judgment normal.    Ortho Exam patient is able to toe walk but cannot heel walk with weakness on the left with partial foot drop he takes some resistance against anterior tib which is able to fire can be visualized anteriorly on the ankle EHL is also noted to be weak as well as EDC peroneals are strong gastrocsoleus is normal no atrophy.  He has some sciatic notch tenderness.  No weakness on the right foot ankle dorsiflexion plantarflexion peroneals or posterior tib.  Specialty Comments:  No specialty comments available.  Imaging: XR Lumbar Spine 2-3 Views  Result Date: 01/30/2021 AP lateral lumbar spine  images are obtained and reviewed.  This shows multilevel lumbar spondylosis with endplate spurring more prominent at L1-2, L3-4 and L5-S1.  Disc base narrowing noted multiple levels.  No anterolisthesis. Impression: Lumbar disc degeneration with spondylosis    Narrative & Impression  CLINICAL DATA:  Low back pain over the last 6 weeks. Numbness and tingling of the left leg.   EXAM: MRI LUMBAR SPINE WITHOUT CONTRAST   TECHNIQUE: Multiplanar, multisequence MR imaging of the lumbar spine was performed. No intravenous contrast was administered.   COMPARISON:  CT abdomen 07/20/2015   FINDINGS: Segmentation:  5 lumbar type vertebral bodies.   Alignment:  Straightening of the normal lumbar lordosis.   Vertebrae: Chronic endplate marrow changes without significant active edematous component.   Conus medullaris and cauda equina: Conus extends to the L1-2 level. Conus and cauda equina appear normal.   Paraspinal and other soft tissues: Negative   Disc levels:   L1-2: Disc degeneration with loss of disc height. Endplate osteophytes and bulging of the disc more prominent towards the right. Indentation of the thecal sac but without compression of the conus tip or nerve roots. Mild right lateral recess and foraminal narrowing.   L2-3: Mild bulging of the disc.  No compressive stenosis.   L3-4: Chronic disc degeneration with loss of disc height. Endplate osteophytes and bulging of the disc. Mild multifactorial canal stenosis. Bilateral lateral recess stenosis. Neural compression is possible at this level.   L4-5: Shallow herniation of the disc with slight caudal down turning. Facet and ligamentous hypertrophy. Narrowing of both subarticular lateral recesses that could compress either L5 nerve. Foraminal narrowing on the left that could possibly affect the exiting left L4 nerve.   L5-S1: Disc degeneration with chronic loss of disc height. Endplate osteophytes and bulging of the  disc, slightly more prominent towards the left. No compressive narrowing of the central canal. No S1 nerve compression is seen. Bilateral foraminal stenosis could affect either exiting L5 nerve.   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   L3-4: Chronic disc space narrowing. Endplate osteophytes and bulging of the disc. Mild facet and ligamentous prominence. Stenosis of both lateral recesses which could possibly cause neural compression.   L4-5: Broad-based disc herniation with slight caudal down turning. Facet and ligamentous hypertrophy more on the left. Stenosis of both subarticular lateral recesses that could compress either L5 nerve. Left foraminal narrowing that could affect the exiting left L4 nerve.   L5-S1: Chronic disc degeneration with endplate osteophytes and bulging of the disc slightly more  prominent towards the left. Bilateral foraminal stenosis that could compress either exiting L5 nerve.     Electronically Signed   By: Paulina Fusi M.D.   On: 01/20/2021 17:38    PMFS History: Patient Active Problem List   Diagnosis Date Noted   Acute pain of left shoulder 06/17/2016   No past medical history on file.  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

## 2021-01-31 ENCOUNTER — Other Ambulatory Visit: Payer: Self-pay

## 2021-01-31 ENCOUNTER — Other Ambulatory Visit: Payer: Self-pay | Admitting: Internal Medicine

## 2021-01-31 NOTE — Telephone Encounter (Signed)
Requested medication (s) are due for refill today: yes  Requested medication (s) are on the active medication list: yes    Last refill: 01/15/21  #60  0 refills  Future visit scheduled not with PCP  Notes to clinic:not delegated  Requested Prescriptions  Pending Prescriptions Disp Refills   traMADol (ULTRAM) 50 MG tablet 60 tablet 0    Sig: Take 1 tablet (50 mg total) by mouth every 6 (six) hours.     Not Delegated - Analgesics:  Opioid Agonists Failed - 01/31/2021  7:10 AM      Failed - This refill cannot be delegated      Failed - Urine Drug Screen completed in last 360 days      Passed - Valid encounter within last 6 months    Recent Outpatient Visits           3 weeks ago Lumbar radiculopathy   [place], MD       Future Appointments             In 1 month Lorin Mercy, Thana Farr, MD [facility]

## 2021-02-02 ENCOUNTER — Ambulatory Visit: Payer: Self-pay | Attending: Family Medicine

## 2021-02-02 ENCOUNTER — Encounter: Payer: Self-pay | Admitting: Internal Medicine

## 2021-02-02 ENCOUNTER — Other Ambulatory Visit: Payer: Self-pay

## 2021-02-02 ENCOUNTER — Telehealth: Payer: Self-pay | Admitting: Internal Medicine

## 2021-02-02 ENCOUNTER — Other Ambulatory Visit (HOSPITAL_COMMUNITY): Payer: Self-pay

## 2021-02-02 NOTE — Telephone Encounter (Signed)
Jonathan Rasmussen came into clinic to request refill on Tramadol . Advised to allow 24-48 hrs

## 2021-02-03 MED ORDER — TRAMADOL HCL 50 MG PO TABS
50.0000 mg | ORAL_TABLET | Freq: Four times a day (QID) | ORAL | 0 refills | Status: DC
  Filled 2021-02-03 – 2021-02-05 (×2): qty 120, 30d supply, fill #0

## 2021-02-05 ENCOUNTER — Other Ambulatory Visit (HOSPITAL_COMMUNITY): Payer: Self-pay

## 2021-02-06 ENCOUNTER — Encounter: Payer: Self-pay | Admitting: Orthopaedic Surgery

## 2021-02-06 NOTE — Telephone Encounter (Signed)
noted 

## 2021-02-13 ENCOUNTER — Telehealth: Payer: Self-pay | Admitting: Orthopaedic Surgery

## 2021-02-13 DIAGNOSIS — M545 Low back pain, unspecified: Secondary | ICD-10-CM

## 2021-02-13 NOTE — Telephone Encounter (Signed)
Dr. Rushie Chestnut I have entered referral for [facility].  Shena-can you help to get patient cost of injection with no insurance?

## 2021-02-13 NOTE — Telephone Encounter (Signed)
Pt's wife calling stating pt is in extreme pain and wanted to know what the out of pocket expense for the inj would be since ins does not kick in until Feb and he can not last that long. Pt stated Dr. Ophelia Charter did not want to do any injs until his ins began in Feb but pt is in unbearable pain and any medication that Dr. Ophelia Charter has prescribed has not helped. The best call back for the pt is [phone].

## 2021-02-26 ENCOUNTER — Other Ambulatory Visit: Payer: Self-pay

## 2021-02-26 ENCOUNTER — Encounter (HOSPITAL_COMMUNITY): Payer: Self-pay | Admitting: Emergency Medicine

## 2021-02-26 ENCOUNTER — Ambulatory Visit (HOSPITAL_COMMUNITY)
Admission: EM | Admit: 2021-02-26 | Discharge: 2021-02-26 | Disposition: A | Discharge status: Home / Self Care | Payer: Self-pay | Attending: Physician Assistant | Admitting: Physician Assistant

## 2021-02-26 DIAGNOSIS — S61213A Laceration without foreign body of left middle finger without damage to nail, initial encounter: Secondary | ICD-10-CM

## 2021-02-26 DIAGNOSIS — S61215A Laceration without foreign body of left ring finger without damage to nail, initial encounter: Secondary | ICD-10-CM

## 2021-02-26 MED ORDER — LIDOCAINE HCL (PF) 1 % IJ SOLN
INTRAMUSCULAR | Status: AC
  Filled 2021-02-26: qty 2

## 2021-02-26 NOTE — ED Triage Notes (Signed)
Pt eval. By Provider Shanda Bumps.

## 2021-02-26 NOTE — ED Provider Notes (Addendum)
[facility]    CSN: [id number] Arrival date & time: 02/26/21  1740      History   Chief Complaint Chief Complaint  Patient presents with   Laceration    HPI Jonathan Rasmussen is a 51 y.o. male.   Pt presents with laceration to left middle and ring fingers after he cut them several hours ago on a knife he was cleaning.  Wounds are continuing to bleed.  He reports last tetanus within 5 years.  He has no other injuries.    History reviewed. No pertinent past medical history.  Patient Active Problem List   Diagnosis Date Noted   Acute pain of left shoulder 06/17/2016    Past Surgical History:  Procedure Laterality Date   NO PAST SURGERIES         Home Medications    Prior to Admission medications   Medication Sig Start Date End Date Taking? Authorizing Provider  meloxicam (MOBIC) 15 MG tablet Take 1 tablet (15 mg total) by mouth daily. 01/23/21   Marcine Matar, MD  methocarbamol (ROBAXIN) 500 MG tablet Take 1 tablet (500 mg total) by mouth 2 (two) times daily as needed for muscle spasms (medication can cause drowsiness). 01/08/21   Marcine Matar, MD  traMADol (ULTRAM) 50 MG tablet Take 1 tablet (50 mg total) by mouth every 6 (six) hours as needed. 01/05/21   Marcine Matar, MD  traMADol (ULTRAM) 50 MG tablet Take 1 tablet (50 mg total) by mouth every 6 (six) hours. 02/03/21   Marcine Matar, MD    Family History Family History  Problem Relation Age of Onset   Breast cancer Mother     Social History Social History   Tobacco Use   Smoking status: Every Day    Packs/day: 0.50    Types: Cigarettes   Smokeless tobacco: Never  Vaping Use   Vaping Use: Never used  Substance Use Topics   Alcohol use: Yes   Drug use: Yes    Types: Marijuana     Allergies   Patient has no known allergies.   Review of Systems Review of Systems  Constitutional:  Negative for chills and fever.  HENT:  Negative for ear pain and sore throat.   Eyes:   Negative for pain and visual disturbance.  Respiratory:  Negative for cough and shortness of breath.   Cardiovascular:  Negative for chest pain and palpitations.  Gastrointestinal:  Negative for abdominal pain and vomiting.  Genitourinary:  Negative for dysuria and hematuria.  Musculoskeletal:  Negative for arthralgias and back pain.  Skin:  Positive for wound. Negative for color change and rash.  Neurological:  Negative for seizures and syncope.  All other systems reviewed and are negative.   Physical Exam Triage Vital Signs ED Triage Vitals  Enc Vitals Group     BP 02/26/21 1854 139/78     Pulse Rate 02/26/21 1854 68     Resp 02/26/21 1854 18     Temp 02/26/21 1854 98.5 F (36.9 C)     Temp Source 02/26/21 1854 Oral     SpO2 02/26/21 1854 97 %     Weight --      Height --      Head Circumference --      Peak Flow --      Pain Score 02/26/21 1852 5     Pain Loc --      Pain Edu? --      Excl.  in GC? --    No data found.  Updated Vital Signs BP 139/78 (BP Location: Right Arm)   Pulse 68   Temp 98.5 F (36.9 C) (Oral)   Resp 18   SpO2 97%   Visual Acuity Right Eye Distance:   Left Eye Distance:   Bilateral Distance:    Right Eye Near:   Left Eye Near:    Bilateral Near:     Physical Exam Vitals and nursing note reviewed.  Constitutional:      General: He is not in acute distress.    Appearance: He is well-developed.  HENT:     Head: Normocephalic and atraumatic.  Eyes:     Conjunctiva/sclera: Conjunctivae normal.  Cardiovascular:     Rate and Rhythm: Normal rate and regular rhythm.     Heart sounds: No murmur heard. Pulmonary:     Effort: Pulmonary effort is normal. No respiratory distress.     Breath sounds: Normal breath sounds.  Abdominal:     Palpations: Abdomen is soft.     Tenderness: There is no abdominal tenderness.  Musculoskeletal:        General: No swelling.     Left hand: Laceration (linear lacerations to left middle and ring fingers)  present.     Cervical back: Neck supple.  Skin:    General: Skin is warm and dry.     Capillary Refill: Capillary refill takes less than 2 seconds.  Neurological:     Mental Status: He is alert.  Psychiatric:        Mood and Affect: Mood normal.     UC Treatments / Results  Labs (all labs ordered are listed, but only abnormal results are displayed) Labs Reviewed - No data to display  EKG   Radiology No results found.  Procedures Laceration Repair  Date/Time: 02/26/2021 8:13 PM Performed by: Ward, Tylene Fantasia, PA-C Authorized by: Ward, Tylene Fantasia, PA-C   Consent:    Consent obtained:  Verbal   Consent given by:  Patient   Risks, benefits, and alternatives were discussed: yes     Risks discussed:  Need for additional repair and poor wound healing   Alternatives discussed:  No treatment Universal protocol:    Procedure explained and questions answered to patient or proxy's satisfaction: yes     Immediately prior to procedure, a time out was called: yes     Patient identity confirmed:  Verbally with patient Anesthesia:    Anesthesia method:  Local infiltration   Local anesthetic:  Lidocaine 1% w/o epi Laceration details:    Location:  Finger   Finger location:  L long finger   Length (cm):  1 Pre-procedure details:    Preparation:  Patient was prepped and draped in usual sterile fashion Treatment:    Area cleansed with:  Povidone-iodine   Amount of cleaning:  Standard   Irrigation solution:  Sterile saline   Irrigation method:  Syringe   Visualized foreign bodies/material removed: no     Debridement:  None   Undermining:  None Skin repair:    Repair method:  Sutures   Suture size:  5-0   Suture material:  Nylon   Suture technique:  Simple interrupted   Number of sutures:  4 Approximation:    Approximation:  Close Repair type:    Repair type:  Simple Post-procedure details:    Dressing:  Non-adherent dressing and adhesive bandage   Procedure completion:   Tolerated Comments:     Laceration repair  to left ring finger as well, 1cm linear lac. 5-0 nylon sutures used, 4 sutures in total, simple interrupted. Non adherent dressing and adhesive bandage applied. Pt tolerated well.  (including critical care time)  Medications Ordered in UC Medications - No data to display  Initial Impression / Assessment and Plan / UC Course  I have reviewed the triage vital signs and the nursing notes.  Pertinent labs & imaging results that were available during my care of the patient were reviewed by me and considered in my medical decision making (see chart for details).     Laceration repair to left middle and ring finger in clinic today. Pt tolerated well.  Tetanus up to date. Advised to return in 5-7 days for suture removal.   Final Clinical Impressions(s) / UC Diagnoses   Final diagnoses:  Laceration of left middle finger without foreign body without damage to nail, initial encounter  Laceration of left ring finger without foreign body without damage to nail, initial encounter     Discharge Instructions      Return in 5-7 days for suture removal  Watch for signs of infection like redness, swelling, or discharge. Return sooner if you see these symptoms.    ED Prescriptions   None    PDMP not reviewed this encounter.   Ward, Tylene Fantasia, PA-C 02/26/21 2016    Ward, Tylene Fantasia, PA-C 04/20/21 1114

## 2021-02-26 NOTE — ED Triage Notes (Signed)
Pt reports he was cleaning and restoring a 51 year old knife and when trying to take handle off then knife part came out of vise and cut left 3rd and 4th fingers.

## 2021-02-26 NOTE — Discharge Instructions (Signed)
Return in 5-7 days for suture removal  Watch for signs of infection like redness, swelling, or discharge. Return sooner if you see these symptoms.

## 2021-02-27 ENCOUNTER — Ambulatory Visit: Payer: Self-pay

## 2021-02-27 ENCOUNTER — Telehealth: Payer: Self-pay | Admitting: Physical Medicine and Rehabilitation

## 2021-02-27 ENCOUNTER — Ambulatory Visit (INDEPENDENT_AMBULATORY_CARE_PROVIDER_SITE_OTHER): Payer: Self-pay | Admitting: Physical Medicine and Rehabilitation

## 2021-02-27 ENCOUNTER — Encounter: Payer: Self-pay | Admitting: Physical Medicine and Rehabilitation

## 2021-02-27 ENCOUNTER — Telehealth: Payer: Self-pay | Admitting: Orthopaedic Surgery

## 2021-02-27 VITALS — BP 145/84 | HR 88

## 2021-02-27 DIAGNOSIS — M5416 Radiculopathy, lumbar region: Secondary | ICD-10-CM

## 2021-02-27 MED ORDER — METHYLPREDNISOLONE ACETATE 80 MG/ML IJ SUSP
80.0000 mg | Freq: Once | INTRAMUSCULAR | Status: AC
  Administered 2021-02-27: 80 mg

## 2021-02-27 NOTE — Patient Instructions (Signed)

## 2021-02-27 NOTE — Progress Notes (Signed)
Pt state lower back pain that travels down his left leg and numbness down his right leg. Pt state walking and carrying items makes the pain worse. Pt state he takes pain meds to help ease his pain.   Numeric Pain Rating Scale and Functional Assessment Average Pain 7   In the last MONTH (on 0-10 scale) has pain interfered with the following?  1. General activity like being  able to carry out your everyday physical activities such as walking, climbing stairs, carrying groceries, or moving a chair?  Rating(10)   +Driver, -BT, -Dye Allergies.

## 2021-02-27 NOTE — Telephone Encounter (Signed)
Patient was here. Would like some pain medication called in for him. His call back number is [phone] Tramadol

## 2021-02-27 NOTE — Telephone Encounter (Signed)
Please advise 

## 2021-02-27 NOTE — Telephone Encounter (Signed)
Pt called requesting a refill of pain medication. Please call pt at [phone].

## 2021-02-27 NOTE — Telephone Encounter (Signed)
LMOM for patient of the below message from Dr. Yates  

## 2021-02-28 NOTE — Procedures (Signed)
Lumbar Epidural Steroid Injection - Interlaminar Approach with Fluoroscopic Guidance  Patient: Jonathan Rasmussen      Date of Birth: 02-25-1970 MRN: [id number] PCP: Marcine Matar, MD      Visit Date: 02/27/2021   Universal Protocol:     Consent Given By: the patient  Position: PRONE  Additional Comments: Vital signs were monitored before and after the procedure. Patient was prepped and draped in the usual sterile fashion. The correct patient, procedure, and site was verified.   Injection Procedure Details:   Procedure diagnoses: Lumbar radiculopathy [M54.16]   Meds Administered:  Meds ordered this encounter  Medications   methylPREDNISolone acetate (DEPO-MEDROL) injection 80 mg     Laterality: Left  Location/Site:  L4-5  Needle: 3.5 in., 20 ga. Tuohy  Needle Placement: Paramedian epidural  Findings:   -Comments: Excellent flow of contrast into the epidural space.  Procedure Details: Using a paramedian approach from the side mentioned above, the region overlying the inferior lamina was localized under fluoroscopic visualization and the soft tissues overlying this structure were infiltrated with 4 ml. of 1% Lidocaine without Epinephrine. The Tuohy needle was inserted into the epidural space using a paramedian approach.   The epidural space was localized using loss of resistance along with counter oblique bi-planar fluoroscopic views.  After negative aspirate for air, blood, and CSF, a 2 ml. volume of Isovue-250 was injected into the epidural space and the flow of contrast was observed. Radiographs were obtained for documentation purposes.    The injectate was administered into the level noted above.   Additional Comments:  The patient tolerated the procedure well Dressing: 2 x 2 sterile gauze and Band-Aid    Post-procedure details: Patient was observed during the procedure. Post-procedure instructions were reviewed.  Patient left the clinic in stable  condition.

## 2021-02-28 NOTE — Progress Notes (Signed)
[name] - 51 y.o. male MRN [id number]  Date of birth: Nov 25, 1970  Office Visit Note: Visit Date: 02/27/2021 PCP: Marcine Matar, MD Referred by: Marcine Matar, MD  Subjective: Chief Complaint  Patient presents with   Lower Back - Pain   Right Leg - Numbness   Left Leg - Pain   HPI:  Jonathan Rasmussen is a 51 y.o. male who comes in today at the request of Dr. Annell Greening for planned Left L4-5 Lumbar Interlaminar epidural steroid injection with fluoroscopic guidance.  The patient has failed conservative care including home exercise, medications, time and activity modification.  This injection will be diagnostic and hopefully therapeutic.  Please see requesting physician notes for further details and justification. MRI reviewed with images and spine model.  MRI reviewed in the note below.  Patient asking for pain medication today.  States they were about out of tramadol that they were taking.  He has an appointment with Dr. Ophelia Charter on February 3.  Pain medication been provided by his primary care physician Dr. Jonah Blue.  He received 120 tablets earlier this month.  He is also receiving meloxicam.  I did have them try to contact Dr. Ophelia Charter to see what pain medication might be done for him until he sees Dr. Ophelia Charter.  I am hopeful that the injection does well enough that he will not need that.  ROS Otherwise per HPI.  Assessment & Plan: Visit Diagnoses:    ICD-10-CM   1. Lumbar radiculopathy  M54.16 XR C-ARM NO REPORT    Epidural Steroid injection    methylPREDNISolone acetate (DEPO-MEDROL) injection 80 mg      Plan: No additional findings.   Meds & Orders:  Meds ordered this encounter  Medications   methylPREDNISolone acetate (DEPO-MEDROL) injection 80 mg    Orders Placed This Encounter  Procedures   XR C-ARM NO REPORT   Epidural Steroid injection    Follow-up: Return for Annell Greening, MD as scheduled.   Procedures: No procedures performed  Lumbar Epidural Steroid  Injection - Interlaminar Approach with Fluoroscopic Guidance  Patient: Jonathan Rasmussen      Date of Birth: 04-Sep-1970 MRN: [id number] PCP: Marcine Matar, MD      Visit Date: 02/27/2021   Universal Protocol:     Consent Given By: the patient  Position: PRONE  Additional Comments: Vital signs were monitored before and after the procedure. Patient was prepped and draped in the usual sterile fashion. The correct patient, procedure, and site was verified.   Injection Procedure Details:   Procedure diagnoses: Lumbar radiculopathy [M54.16]   Meds Administered:  Meds ordered this encounter  Medications   methylPREDNISolone acetate (DEPO-MEDROL) injection 80 mg     Laterality: Left  Location/Site:  L4-5  Needle: 3.5 in., 20 ga. Tuohy  Needle Placement: Paramedian epidural  Findings:   -Comments: Excellent flow of contrast into the epidural space.  Procedure Details: Using a paramedian approach from the side mentioned above, the region overlying the inferior lamina was localized under fluoroscopic visualization and the soft tissues overlying this structure were infiltrated with 4 ml. of 1% Lidocaine without Epinephrine. The Tuohy needle was inserted into the epidural space using a paramedian approach.   The epidural space was localized using loss of resistance along with counter oblique bi-planar fluoroscopic views.  After negative aspirate for air, blood, and CSF, a 2 ml. volume of Isovue-250 was injected into the epidural space and the flow of contrast was observed.  Radiographs were obtained for documentation purposes.    The injectate was administered into the level noted above.   Additional Comments:  The patient tolerated the procedure well Dressing: 2 x 2 sterile gauze and Band-Aid    Post-procedure details: Patient was observed during the procedure. Post-procedure instructions were reviewed.  Patient left the clinic in stable condition.    Clinical  History: MRI LUMBAR SPINE WITHOUT CONTRAST   TECHNIQUE: Multiplanar, multisequence MR imaging of the lumbar spine was performed. No intravenous contrast was administered.   COMPARISON:  CT abdomen 07/20/2015   FINDINGS: Segmentation:  5 lumbar type vertebral bodies.   Alignment:  Straightening of the normal lumbar lordosis.   Vertebrae: Chronic endplate marrow changes without significant active edematous component.   Conus medullaris and cauda equina: Conus extends to the L1-2 level. Conus and cauda equina appear normal.   Paraspinal and other soft tissues: Negative   Disc levels:   L1-2: Disc degeneration with loss of disc height. Endplate osteophytes and bulging of the disc more prominent towards the right. Indentation of the thecal sac but without compression of the conus tip or nerve roots. Mild right lateral recess and foraminal narrowing.   L2-3: Mild bulging of the disc.  No compressive stenosis.   L3-4: Chronic disc degeneration with loss of disc height. Endplate osteophytes and bulging of the disc. Mild multifactorial canal stenosis. Bilateral lateral recess stenosis. Neural compression is possible at this level.   L4-5: Shallow herniation of the disc with slight caudal down turning. Facet and ligamentous hypertrophy. Narrowing of both subarticular lateral recesses that could compress either L5 nerve. Foraminal narrowing on the left that could possibly affect the exiting left L4 nerve.   L5-S1: Disc degeneration with chronic loss of disc height. Endplate osteophytes and bulging of the disc, slightly more prominent towards the left. No compressive narrowing of the central canal. No S1 nerve compression is seen. Bilateral foraminal stenosis could affect either exiting L5 nerve.   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   L3-4: Chronic disc space narrowing. Endplate osteophytes and bulging of the disc. Mild facet and ligamentous  prominence. Stenosis of both lateral recesses which could possibly cause neural compression.   L4-5: Broad-based disc herniation with slight caudal down turning. Facet and ligamentous hypertrophy more on the left. Stenosis of both subarticular lateral recesses that could compress either L5 nerve. Left foraminal narrowing that could affect the exiting left L4 nerve.   L5-S1: Chronic disc degeneration with endplate osteophytes and bulging of the disc slightly more prominent towards the left. Bilateral foraminal stenosis that could compress either exiting L5 nerve.     Electronically Signed   By: Paulina Fusi M.D.   On: 01/20/2021 17:38     Objective:  VS:  HT:     WT:    BMI:      BP:(!) 145/84   HR:88bpm   TEMP: ( )   RESP:  Physical Exam Vitals and nursing note reviewed.  Constitutional:      General: He is not in acute distress.    Appearance: Normal appearance. He is not ill-appearing.  HENT:     Head: Normocephalic and atraumatic.     Right Ear: External ear normal.     Left Ear: External ear normal.     Nose: No congestion.  Eyes:     Extraocular Movements: Extraocular movements intact.  Cardiovascular:     Rate and Rhythm: Normal rate.     Pulses: Normal pulses.  Pulmonary:     Effort: Pulmonary effort is normal. No respiratory distress.  Abdominal:     General: There is no distension.     Palpations: Abdomen is soft.  Musculoskeletal:        General: No tenderness or signs of injury.     Cervical back: Neck supple.     Right lower leg: No edema.     Left lower leg: No edema.     Comments: Patient has good distal strength without clonus.  Skin:    Findings: No erythema or rash.  Neurological:     General: No focal deficit present.     Mental Status: He is alert and oriented to person, place, and time.     Sensory: No sensory deficit.     Motor: No weakness or abnormal muscle tone.     Coordination: Coordination normal.  Psychiatric:        Mood and  Affect: Mood normal.        Behavior: Behavior normal.     Imaging: XR C-ARM NO REPORT  Result Date: 02/27/2021 Please see Notes tab for imaging impression.

## 2021-03-02 ENCOUNTER — Other Ambulatory Visit: Payer: Self-pay

## 2021-03-02 ENCOUNTER — Ambulatory Visit: Payer: 59 | Admitting: Orthopaedic Surgery

## 2021-03-02 DIAGNOSIS — M5126 Other intervertebral disc displacement, lumbar region: Secondary | ICD-10-CM

## 2021-03-02 DIAGNOSIS — M545 Low back pain, unspecified: Secondary | ICD-10-CM

## 2021-03-02 MED ORDER — GABAPENTIN 100 MG PO CAPS: 100.0000 mg | ORAL_CAPSULE | Freq: Three times a day (TID) | ORAL | 1 refills | Status: DC

## 2021-03-02 NOTE — Progress Notes (Signed)
Office Visit Note   Patient: Jonathan Rasmussen           Date of Birth: Jan 03, 1971           MRN: [id number] Visit Date: 03/02/2021              Requested by: Marcine Matar, MD [address] [place],  [place] [address] PCP: Marcine Matar, MD   Assessment & Plan: Visit Diagnoses:  1. Lumbar disc herniation   2.      Gait disturbance, ataxia.  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  The something he does not feel like he can do at this point.  He has had some ataxia with episodes of falling that does not seem to be related to slight dorsiflexion weakness on his left side.  Would recommend neurology consultation he can follow-up with me after.   Follow-Up Instructions: Return in about 6 weeks (around 04/13/2021).   Orders:  No orders of the defined types were placed in this encounter.  Meds ordered this encounter  Medications   gabapentin (NEURONTIN) 100 MG capsule    Sig: Take 1 capsule (100 mg total) by mouth 3 (three) times daily.    Dispense:  60 capsule    Refill:  1      Procedures: No procedures performed   Clinical Data: No additional findings.   Subjective: Chief Complaint  Patient presents with   Lower Back - Follow-up    HPI 51 year old male returns with ongoing problems with back pain and problems with ambulation.  Patient had some disc herniation with caudal downturn at L4-5 this had problems with ataxia gait problems and falling.  He has not been seen by neurologist.  He had a single epidural without relief.  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  Stepfather has had back problems.  MRI scan 01/20/2021 showed broad-based disc herniation with some stenosis of the subarticular lateral recess that could  compress either L5 nerve root.  Some left foraminal narrowing that could affect the exiting left L4 nerve.  Review of Systems all other systems noncontributory to HPI.   Objective: Vital Signs: Ht 5\' 10"  (1.778 m)    Wt 150 lb (68 kg)    BMI 21.52 kg/m   Physical Exam Constitutional:      Appearance: He is well-developed.  HENT:     Head: Normocephalic and atraumatic.     Right Ear: External ear normal.     Left Ear: External ear normal.  Eyes:     Pupils: Pupils are equal, round, and reactive to light.  Neck:     Thyroid: No thyromegaly.     Trachea: No tracheal deviation.  Cardiovascular:     Rate and Rhythm: Normal rate.  Pulmonary:     Effort: Pulmonary effort is normal.     Breath sounds: No wheezing.  Abdominal:     General: Bowel sounds are normal.     Palpations: Abdomen is soft.  Musculoskeletal:     Cervical back: Neck supple.  Skin:    General: Skin is warm and dry.     Capillary Refill: Capillary refill takes less than 2 seconds.  Neurological:     Mental Status: He  is alert and oriented to person, place, and time.  Psychiatric:        Behavior: Behavior normal.        Thought Content: Thought content normal.        Judgment: Judgment normal.    Ortho Exam patient has problems with heel walking he has some weakness of the anterior tib with resistive testing without atrophy.  Specialty Comments:  No specialty comments available.  Imaging: No results found.   PMFS History: Patient Active Problem List   Diagnosis Date Noted   Lumbar disc herniation 03/08/2021   Acute pain of left shoulder 06/17/2016   No past medical history on file.  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

## 2021-03-08 DIAGNOSIS — M5126 Other intervertebral disc displacement, lumbar region: Secondary | ICD-10-CM | POA: Insufficient documentation

## 2021-03-10 ENCOUNTER — Other Ambulatory Visit: Payer: Self-pay | Admitting: Internal Medicine

## 2021-03-12 ENCOUNTER — Other Ambulatory Visit (HOSPITAL_COMMUNITY): Payer: Self-pay

## 2021-03-12 DIAGNOSIS — M5442 Lumbago with sciatica, left side: Secondary | ICD-10-CM | POA: Diagnosis not present

## 2021-03-12 DIAGNOSIS — M9901 Segmental and somatic dysfunction of cervical region: Secondary | ICD-10-CM | POA: Diagnosis not present

## 2021-03-12 DIAGNOSIS — M9903 Segmental and somatic dysfunction of lumbar region: Secondary | ICD-10-CM | POA: Diagnosis not present

## 2021-03-12 DIAGNOSIS — M47812 Spondylosis without myelopathy or radiculopathy, cervical region: Secondary | ICD-10-CM | POA: Diagnosis not present

## 2021-03-12 NOTE — Telephone Encounter (Signed)
Requested medication (s) are due for refill today: Yes  Requested medication (s) are on the active medication list: Yes  Last refill:  02/03/21  Future visit scheduled: No  Notes to clinic:  See request.    Requested Prescriptions  Pending Prescriptions Disp Refills   traMADol (ULTRAM) 50 MG tablet 120 tablet 0    Sig: Take 1 tablet (50 mg total) by mouth every 6 (six) hours.     Not Delegated - Analgesics:  Opioid Agonists Failed - 03/10/2021 12:30 PM      Failed - This refill cannot be delegated      Failed - Urine Drug Screen completed in last 360 days      Passed - Valid encounter within last 3 months    Recent Outpatient Visits           2 months ago Lumbar radiculopathy   [facility] Marcine Matar, MD

## 2021-03-13 ENCOUNTER — Other Ambulatory Visit (HOSPITAL_COMMUNITY): Payer: Self-pay

## 2021-03-13 DIAGNOSIS — M9903 Segmental and somatic dysfunction of lumbar region: Secondary | ICD-10-CM | POA: Diagnosis not present

## 2021-03-13 DIAGNOSIS — M5442 Lumbago with sciatica, left side: Secondary | ICD-10-CM | POA: Diagnosis not present

## 2021-03-13 DIAGNOSIS — M47812 Spondylosis without myelopathy or radiculopathy, cervical region: Secondary | ICD-10-CM | POA: Diagnosis not present

## 2021-03-13 DIAGNOSIS — M9901 Segmental and somatic dysfunction of cervical region: Secondary | ICD-10-CM | POA: Diagnosis not present

## 2021-03-13 MED ORDER — TRAMADOL HCL 50 MG PO TABS
50.0000 mg | ORAL_TABLET | Freq: Four times a day (QID) | ORAL | 0 refills | Status: DC
  Filled 2021-03-13: qty 120, 30d supply, fill #0

## 2021-03-14 ENCOUNTER — Other Ambulatory Visit (HOSPITAL_COMMUNITY): Payer: Self-pay

## 2021-03-14 DIAGNOSIS — M9903 Segmental and somatic dysfunction of lumbar region: Secondary | ICD-10-CM | POA: Diagnosis not present

## 2021-03-14 DIAGNOSIS — M9901 Segmental and somatic dysfunction of cervical region: Secondary | ICD-10-CM | POA: Diagnosis not present

## 2021-03-14 DIAGNOSIS — M5442 Lumbago with sciatica, left side: Secondary | ICD-10-CM | POA: Diagnosis not present

## 2021-03-14 DIAGNOSIS — M47812 Spondylosis without myelopathy or radiculopathy, cervical region: Secondary | ICD-10-CM | POA: Diagnosis not present

## 2021-03-14 NOTE — Telephone Encounter (Signed)
Pt has been scheduled.  °

## 2021-03-16 DIAGNOSIS — M9901 Segmental and somatic dysfunction of cervical region: Secondary | ICD-10-CM | POA: Diagnosis not present

## 2021-03-16 DIAGNOSIS — M5442 Lumbago with sciatica, left side: Secondary | ICD-10-CM | POA: Diagnosis not present

## 2021-03-16 DIAGNOSIS — M47812 Spondylosis without myelopathy or radiculopathy, cervical region: Secondary | ICD-10-CM | POA: Diagnosis not present

## 2021-03-16 DIAGNOSIS — M9903 Segmental and somatic dysfunction of lumbar region: Secondary | ICD-10-CM | POA: Diagnosis not present

## 2021-03-19 DIAGNOSIS — M9901 Segmental and somatic dysfunction of cervical region: Secondary | ICD-10-CM | POA: Diagnosis not present

## 2021-03-19 DIAGNOSIS — M47812 Spondylosis without myelopathy or radiculopathy, cervical region: Secondary | ICD-10-CM | POA: Diagnosis not present

## 2021-03-19 DIAGNOSIS — M9903 Segmental and somatic dysfunction of lumbar region: Secondary | ICD-10-CM | POA: Diagnosis not present

## 2021-03-19 DIAGNOSIS — M5442 Lumbago with sciatica, left side: Secondary | ICD-10-CM | POA: Diagnosis not present

## 2021-03-20 ENCOUNTER — Encounter: Payer: Self-pay | Admitting: Neurology

## 2021-03-20 ENCOUNTER — Ambulatory Visit: Payer: 59 | Attending: Internal Medicine | Admitting: Internal Medicine

## 2021-03-20 ENCOUNTER — Encounter: Payer: Self-pay | Admitting: Internal Medicine

## 2021-03-20 ENCOUNTER — Other Ambulatory Visit: Payer: Self-pay

## 2021-03-20 VITALS — BP 122/81 | HR 84 | Resp 16 | Wt 150.0 lb

## 2021-03-20 DIAGNOSIS — M9901 Segmental and somatic dysfunction of cervical region: Secondary | ICD-10-CM | POA: Diagnosis not present

## 2021-03-20 DIAGNOSIS — M9903 Segmental and somatic dysfunction of lumbar region: Secondary | ICD-10-CM | POA: Diagnosis not present

## 2021-03-20 DIAGNOSIS — M47812 Spondylosis without myelopathy or radiculopathy, cervical region: Secondary | ICD-10-CM | POA: Diagnosis not present

## 2021-03-20 DIAGNOSIS — M5416 Radiculopathy, lumbar region: Secondary | ICD-10-CM | POA: Diagnosis not present

## 2021-03-20 DIAGNOSIS — M5442 Lumbago with sciatica, left side: Secondary | ICD-10-CM | POA: Diagnosis not present

## 2021-03-20 DIAGNOSIS — M21371 Foot drop, right foot: Secondary | ICD-10-CM

## 2021-03-20 DIAGNOSIS — Z79891 Long term (current) use of opiate analgesic: Secondary | ICD-10-CM | POA: Diagnosis not present

## 2021-03-20 MED ORDER — GABAPENTIN 300 MG PO CAPS: 300.0000 mg | ORAL_CAPSULE | Freq: Two times a day (BID) | ORAL | 4 refills | Status: DC

## 2021-03-20 NOTE — Progress Notes (Signed)
Patient ID: Jonathan Rasmussen, male    DOB: 06-25-1970  MRN: [id number]  CC: f/u lumbar radiculopathy  Subjective: Jonathan Rasmussen is a 51 y.o. male who presents for f/u lambur radiculopathy His concerns today include:   I saw patient 01/05/2021 for lumbar radiculopathy involving his left lower extremity.  On exam at that time, power in the lower extremities proximally and distally was 5/5 with spasticity. He had weakness of 2/5 in the left foot with dorsi and plantarflexion.  Low foot to floor clearance was noted on the left with ambulation.   -MRI revealed DDD throughout lumbar region, osteoarthritis changes, disc bulge at L5-S1 and broad disc herniation at L4-5.  There was left foraminal narrowing that could affect the exiting left L4 nerve.   -Patient was referred to Dr. Ophelia Charter.  He was seen 01/30/2021.  MRI findings reviewed.  According to his note, disc herniation seen at L4-5 was consistent with the symptoms.  Activity modification was discussed.  Patient offered trial of epidural injection.  Referred to Dr. Alvester Morin and had ESI on 02/27/2021.  Epidural did not helped.  Saw Dr. Ophelia Charter several days after the epidural. Pt asked about whether surgery is indicated.  Pt felt Dr. Ophelia Charter was dismissive of that idea and really did not explore it fully with him.  He was prescribed gabapentin 100 mg to take 3 times a day.  He did not find it helpful. - Reports loss dorseflexion in both feet and decrease sensation of RT dorsal midfoot  and lateral RT lower leg to level of mid to upper calf. Both were going on prior to [facility].  He has problems clearing objects like throw rugs and has had near falls.  Mentioned this to Dr. Ophelia Charter whom he said never seemed to recommend anything in particular for it.  Wife asked about referral to neurologist and Dr. Ophelia Charter agreed. -He was called by [facility] neurology but declined the appointment because he was still upset about his last visit with Dr. Ophelia Charter. Has been seeing a chirapractor  daily for past 1 wk who recommended [place].  He now has insurance. -has set up appt with [facility] for the 05/25/2021 -loss his job because of the issue with his back and leg.  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  Not able to do any type of lifting or standing for any period of time due to current issue. -denied unemployment.  States he was told he left his job not for a good cause.  The chiropractor therapy has not helped with mobility.  It has however helped  decreased pain slightly but not in a meaningful way. Has not been taking Tramadol as much.  Can not tell if it is helping or not.  Taking 1-2 times a day.  Symptoms he reports and not bothering him as much if he is not having to be active.    He denies any loss of bowel or bladder function.  He has not had any falls. Patient Active Problem List   Diagnosis Date Noted   Lumbar disc herniation 03/08/2021   Acute pain of left shoulder 06/17/2016     Current Outpatient Medications on File Prior to Visit  Medication Sig Dispense Refill   gabapentin (NEURONTIN) 100 MG capsule Take 1 capsule (100 mg total) by mouth 3 (three) times daily. 60 capsule 1   meloxicam (MOBIC) 15 MG tablet Take 1 tablet (15 mg total) by mouth daily. 30 tablet  1   methocarbamol (ROBAXIN) 500 MG tablet Take 1 tablet (500 mg total) by mouth 2 (two) times daily as needed for muscle spasms (medication can cause drowsiness). 30 tablet 1   traMADol (ULTRAM) 50 MG tablet Take 1 tablet (50 mg total) by mouth every 6 (six) hours as needed. (Patient not taking: Reported on 03/20/2021) 40 tablet 0   traMADol (ULTRAM) 50 MG tablet Take 1 tablet (50 mg total) by mouth every 6 (six) hours. 120 tablet 0   No current facility-administered medications on file prior to visit.    No Known Allergies  Social History   Socioeconomic History   Marital status: Married    Spouse name: Not on file   Number of children: 1   Years  of education: Not on file   Highest education level: Some college, no degree  Occupational History   Occupation: [employer] technologist  Tobacco Use   Smoking status: Every Day    Packs/day: 0.50    Types: Cigarettes   Smokeless tobacco: Never  Vaping Use   Vaping Use: Never used  Substance and Sexual Activity   Alcohol use: Yes   Drug use: Yes    Types: Marijuana   Sexual activity: Not on file  Other Topics Concern   Not on file  Social History Narrative   Not on file   Social Determinants of Health   Financial Resource Strain: Not on file  Food Insecurity: Not on file  Transportation Needs: Not on file  Physical Activity: Not on file  Stress: Not on file  Social Connections: Not on file  Intimate Partner Violence: Not on file    Family History  Problem Relation Age of Onset   Breast cancer Mother     Past Surgical History:  Procedure Laterality Date   NO PAST SURGERIES      ROS: Review of Systems Negative except as stated above  PHYSICAL EXAM: BP 122/81    Pulse 84    Resp 16    Wt 150 lb (68 kg)    SpO2 96%    BMI 21.52 kg/m   Physical Exam   General appearance - alert, well appearing, middle age caucasian male and in no distress Mental status - normal mood, behavior, speech, dress, motor activity, and thought processes Neurological -power lower extremities 5/5 proximally and distally bilaterally.   Right foot: Dorsiflexion 0/5, plantarflexion 0/5 Left foot: Dorsiflexion 1/5, plantarflexion 1/5 Plantar reflex not upgoing or downgoing on either side. Knee jerk reflex brisk and equal bilaterally. Gross sensation decreased on the dorsal surface of the right midfoot and right lower leg laterally up to about the mid to upper calf. Gait: Noted to have flapping of the right foot when walking making gait a little unsteady   CMP Latest Ref Rng & Units 07/20/2015  Glucose 65 - 99 mg/dL 109(H)  BUN 6 - 20 mg/dL 21(H)  Creatinine 0.61 - 1.24 mg/dL  0.98  Sodium 135 - 145 mmol/L 135  Potassium 3.5 - 5.1 mmol/L 3.8  Chloride 101 - 111 mmol/L 105  CO2 22 - 32 mmol/L 21(L)  Calcium 8.9 - 10.3 mg/dL 9.2  Total Protein 6.5 - 8.1 g/dL 6.7  Total Bilirubin 0.3 - 1.2 mg/dL 0.7  Alkaline Phos 38 - 126 U/L 53  AST 15 - 41 U/L 25  ALT 17 - 63 U/L 19   Lipid Panel  No results found for: CHOL, TRIG, HDL, CHOLHDL, VLDL, LDLCALC, LDLDIRECT  CBC  Component Value Date/Time   WBC 10.9 (H) Q000111Q [id number]   WBC DUPLICATE REQUEST Q000111Q 0650   RBC 4.59 Q000111Q [id number]   RBC DUPLICATE REQUEST Q000111Q 0650   HGB 14.5 Q000111Q [id number]   HGB DUPLICATE REQUEST Q000111Q 0650   HCT 42.6 Q000111Q [id number]   HCT DUPLICATE REQUEST Q000111Q 0650   PLT 249 Q000111Q [id number]   PLT DUPLICATE REQUEST Q000111Q 0650   MCV 92.8 Q000111Q [id number]   MCV DUPLICATE REQUEST Q000111Q 0650   MCH 31.6 Q000111Q [id number]   MCH DUPLICATE REQUEST Q000111Q 0650   MCHC 34.0 Q000111Q [id number]   MCHC DUPLICATE REQUEST Q000111Q 0650   RDW 12.9 Q000111Q [id number]   RDW DUPLICATE REQUEST Q000111Q 0650   LYMPHSABS 1.2 07/20/2015 0650   MONOABS 0.3 07/20/2015 0650   EOSABS 0.1 07/20/2015 0650   BASOSABS 0.1 07/20/2015 0650    Opioid Risk Tool - 03/20/21 2133       Family History of Substance Abuse   Alcohol Positive Male   both parents were alcoholics   Illegal Drugs Negative    Rx Drugs Negative      Personal History of Substance Abuse   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 Lumbar radiculopathy I think it is a good idea for him to get a second opinion about whether surgery is necessary or not given the significant impact this has on his overall functioning and life.   Even though patient already has appointment with [place] [facility], I will formally submit referral to them in case it is required by his insurance.  I will have our scheduler try to move up his appointment. - Ambulatory referral to Neurosurgery  2. Foot drop, right  Has weakness on the LT foot as well but not as pronounced on the RT Differential for etiology includes nerve compression of peroneal nerve which is a peripheral neuropathy, lumbar degenerative disease, lumbar spinal stenosis, trauma to the lower leg etc. I do think you should see a neurologist.  May need EMG to help determine whether this is coming from his back or peripheral injury/compression of the peroneal nerve - Ambulatory referral to Neurology - Ambulatory referral to Neurosurgery  3. Opioid use agreement exists Went over some of the components of our opioid prescribing agreement patient provided with written copy to review and sign if he is agreeable to the terms.  He did sign the agreement.   He admits that he uses marijuana.   He is agreeable to urine drug screen today. He will continue to use the tramadol as needed until he gets in with a neurosurgeon. I recommend trial of increasing Neurontin to 300 mg twice a day.  Advised that the medication can cause drowsiness.  Patient is agreeable to trying this. [id number] 11+Oxyco+Alc+Crt-Bund  Addendum: 03/21/2021: Phone call  placed to patient today.  I told patient that I wanted to offer him referral to physical therapy and prescription for splint for his right foot to help prevent injury to the foot.  Persons with foot drop are at risk for rolling the ankle causing injury to tendons/ligaments or even fracture.   Pt stated that he does not feel he is  bad enough to require either right now.  The chiropractor whom he is seeing has started incorporating exercise activities to help with the issue.  He also feels the increase in dose of Gabapentin in combo with Tramadol has helped in decreasing pain such that he feels he could stand a little longer today without too much discomfort.  I told him if he changes his mind he can let me know.  Pt inquired about the type of splint/brace needed.  I told him I think there is a company in [employer] that can fit him for something like that should he decide to pursue.    Patient was given the opportunity to ask questions.  Patient verbalized understanding of the plan and was able to repeat key elements of the plan.   Orders Placed This Encounter  Procedures   [id number] 11+Oxyco+Alc+Crt-Bund   Ambulatory referral to Neurology   Ambulatory referral to Neurosurgery     Requested Prescriptions    No prescriptions requested or ordered in this encounter    Return in about 4 weeks (around 04/17/2021) for Physical.  Karle Plumber, MD, Rosalita Chessman

## 2021-03-22 DIAGNOSIS — M9901 Segmental and somatic dysfunction of cervical region: Secondary | ICD-10-CM | POA: Diagnosis not present

## 2021-03-22 DIAGNOSIS — M9903 Segmental and somatic dysfunction of lumbar region: Secondary | ICD-10-CM | POA: Diagnosis not present

## 2021-03-22 DIAGNOSIS — M5442 Lumbago with sciatica, left side: Secondary | ICD-10-CM | POA: Diagnosis not present

## 2021-03-22 DIAGNOSIS — M47812 Spondylosis without myelopathy or radiculopathy, cervical region: Secondary | ICD-10-CM | POA: Diagnosis not present

## 2021-03-25 LAB — DRUG SCREEN 764883 11+OXYCO+ALC+CRT-BUND
Amphetamines, Urine: NEGATIVE ng/mL
BENZODIAZ UR QL: NEGATIVE ng/mL
Barbiturate: NEGATIVE ng/mL
Cocaine (Metabolite): NEGATIVE ng/mL
Creatinine: 61.7 mg/dL (ref 20.0–300.0)
Ethanol: NEGATIVE %
Meperidine: NEGATIVE ng/mL
Methadone Screen, Urine: NEGATIVE ng/mL
OPIATE SCREEN URINE: NEGATIVE ng/mL
Oxycodone/Oxymorphone, Urine: NEGATIVE ng/mL
Phencyclidine: NEGATIVE ng/mL
Propoxyphene: NEGATIVE ng/mL
pH, Urine: 6.7 (ref 4.5–8.9)

## 2021-03-25 LAB — CANNABINOID CONFIRMATION, UR
CANNABINOIDS: POSITIVE — AB
Carboxy THC GC/MS Conf: 122 ng/mL

## 2021-03-25 LAB — TRAMADOL GC/MS, URINE
Tramadol gc/ms Conf: 11190 ng/mL
Tramadol: POSITIVE — AB

## 2021-03-26 DIAGNOSIS — M9903 Segmental and somatic dysfunction of lumbar region: Secondary | ICD-10-CM | POA: Diagnosis not present

## 2021-03-26 DIAGNOSIS — M9901 Segmental and somatic dysfunction of cervical region: Secondary | ICD-10-CM | POA: Diagnosis not present

## 2021-03-26 DIAGNOSIS — M5442 Lumbago with sciatica, left side: Secondary | ICD-10-CM | POA: Diagnosis not present

## 2021-03-26 DIAGNOSIS — M47812 Spondylosis without myelopathy or radiculopathy, cervical region: Secondary | ICD-10-CM | POA: Diagnosis not present

## 2021-03-27 DIAGNOSIS — M5442 Lumbago with sciatica, left side: Secondary | ICD-10-CM | POA: Diagnosis not present

## 2021-03-27 DIAGNOSIS — M9903 Segmental and somatic dysfunction of lumbar region: Secondary | ICD-10-CM | POA: Diagnosis not present

## 2021-03-27 DIAGNOSIS — M47812 Spondylosis without myelopathy or radiculopathy, cervical region: Secondary | ICD-10-CM | POA: Diagnosis not present

## 2021-03-27 DIAGNOSIS — M9901 Segmental and somatic dysfunction of cervical region: Secondary | ICD-10-CM | POA: Diagnosis not present

## 2021-03-29 DIAGNOSIS — M47812 Spondylosis without myelopathy or radiculopathy, cervical region: Secondary | ICD-10-CM | POA: Diagnosis not present

## 2021-03-29 DIAGNOSIS — M9903 Segmental and somatic dysfunction of lumbar region: Secondary | ICD-10-CM | POA: Diagnosis not present

## 2021-03-29 DIAGNOSIS — M9901 Segmental and somatic dysfunction of cervical region: Secondary | ICD-10-CM | POA: Diagnosis not present

## 2021-03-29 DIAGNOSIS — M5442 Lumbago with sciatica, left side: Secondary | ICD-10-CM | POA: Diagnosis not present

## 2021-03-30 ENCOUNTER — Telehealth: Payer: Self-pay | Admitting: Internal Medicine

## 2021-03-30 NOTE — Telephone Encounter (Signed)
-----   Message from Dionne Bucy sent at 03/30/2021 11:24 AM EST ----- ?Regarding: Neurology Referral ?Patient have an appointment  with  [facility]  05/14/21  and he is in the  waiting list  for a sooner appt . ?----- Message ----- ?From: Marcine Matar, MD ?Sent: 03/21/2021   2:03 PM EST ?To: Dionne Bucy ? ?This patient has an appointment scheduled in April with [facility] for lumbar radiculopathy symptoms.  He has developed foot drop.  Please call them and see if they are able to move up his appointment. ?I also have submitted a referral to neurology.  Please try to get him in as soon as possible. ? ? ?

## 2021-03-31 ENCOUNTER — Ambulatory Visit (INDEPENDENT_AMBULATORY_CARE_PROVIDER_SITE_OTHER): Payer: 59

## 2021-03-31 ENCOUNTER — Ambulatory Visit (HOSPITAL_COMMUNITY)
Admission: EM | Admit: 2021-03-31 | Discharge: 2021-03-31 | Disposition: A | Discharge status: Home / Self Care | Payer: 59 | Attending: Internal Medicine | Admitting: Internal Medicine

## 2021-03-31 ENCOUNTER — Encounter (HOSPITAL_COMMUNITY): Payer: Self-pay | Admitting: *Deleted

## 2021-03-31 ENCOUNTER — Other Ambulatory Visit: Payer: Self-pay

## 2021-03-31 DIAGNOSIS — S99912A Unspecified injury of left ankle, initial encounter: Secondary | ICD-10-CM | POA: Diagnosis not present

## 2021-03-31 DIAGNOSIS — S93402A Sprain of unspecified ligament of left ankle, initial encounter: Secondary | ICD-10-CM

## 2021-03-31 DIAGNOSIS — M25572 Pain in left ankle and joints of left foot: Secondary | ICD-10-CM

## 2021-03-31 MED ORDER — MELOXICAM 15 MG PO TABS
15.0000 mg | ORAL_TABLET | Freq: Every day | ORAL | 0 refills | Status: DC
Start: 2021-03-31 — End: 2021-09-04

## 2021-03-31 NOTE — ED Triage Notes (Signed)
Pt reports he rolled his Lt ankle last night. ?

## 2021-03-31 NOTE — ED Triage Notes (Signed)
Pt took Oxycodone at home this AM and has tramadol at home for back pain. ?

## 2021-03-31 NOTE — ED Provider Notes (Signed)
?[facility] ? ? ?MRN: [id number] DOB: November 29, 1970 ? ?Subjective:  ? ?Jonathan Rasmussen is a 51 y.o. male presenting for suffering a left ankle injury yesterday.  Patient has had chronic back issues and this causes him to walk differently.  Unfortunately he rolled his ankle laterally and has since had moderate to severe pain with swelling.  He has tried to rest off of it.  He did take a leftover oxycodone but wanted to make sure given the level of his pain that he did have a fracture. ? ?No current facility-administered medications for this encounter. ? ?Current Outpatient Medications:  ?  gabapentin (NEURONTIN) 300 MG capsule, Take 1 capsule (300 mg total) by mouth 2 (two) times daily., Disp: 60 capsule, Rfl: 4 ?  meloxicam (MOBIC) 15 MG tablet, Take 1 tablet (15 mg total) by mouth daily., Disp: 30 tablet, Rfl: 1 ?  methocarbamol (ROBAXIN) 500 MG tablet, Take 1 tablet (500 mg total) by mouth 2 (two) times daily as needed for muscle spasms (medication can cause drowsiness)., Disp: 30 tablet, Rfl: 1 ?  traMADol (ULTRAM) 50 MG tablet, Take 1 tablet (50 mg total) by mouth every 6 (six) hours as needed. (Patient not taking: Reported on 03/20/2021), Disp: 40 tablet, Rfl: 0 ?  traMADol (ULTRAM) 50 MG tablet, Take 1 tablet (50 mg total) by mouth every 6 (six) hours., Disp: 120 tablet, Rfl: 0  ? ?No Known Allergies ? ?History reviewed. No pertinent past medical history.  ? ?Past Surgical History:  ?Procedure Laterality Date  ? NO PAST SURGERIES    ? ? ?Family History  ?Problem Relation Age of Onset  ? Breast cancer Mother   ? ? ?Social History  ? ?Tobacco Use  ? Smoking status: Every Day  ?  Packs/day: 0.50  ?  Types: Cigarettes  ? Smokeless tobacco: Never  ?Vaping Use  ? Vaping Use: Never used  ?Substance Use Topics  ? Alcohol use: Yes  ? Drug use: Yes  ?  Types: Marijuana  ? ? ?ROS ? ? ?Objective:  ? ?Vitals: ?BP 112/83   Pulse 77   Temp 98.4 ?F (36.9 ?C)   Resp 16   SpO2 98%  ? ?Physical  Exam ?Constitutional:   ?   General: He is not in acute distress. ?   Appearance: Normal appearance. He is well-developed and normal weight. He is not ill-appearing, toxic-appearing or diaphoretic.  ?HENT:  ?   Head: Normocephalic and atraumatic.  ?   Right Ear: External ear normal.  ?   Left Ear: External ear normal.  ?   Nose: Nose normal.  ?   Mouth/Throat:  ?   Pharynx: Oropharynx is clear.  ?Eyes:  ?   General: No scleral icterus.    ?   Right eye: No discharge.     ?   Left eye: No discharge.  ?   Extraocular Movements: Extraocular movements intact.  ?Cardiovascular:  ?   Rate and Rhythm: Normal rate.  ?Pulmonary:  ?   Effort: Pulmonary effort is normal.  ?Musculoskeletal:  ?   Cervical back: Normal range of motion.  ?   Left ankle: Swelling present. No deformity, ecchymosis or lacerations. Tenderness present over the lateral malleolus, ATF ligament and AITF ligament. No medial malleolus, CF ligament, posterior TF ligament, base of 5th metatarsal or proximal fibula tenderness. Decreased range of motion.  ?   Left Achilles Tendon: No tenderness or defects. Thompson's test negative.  ?Skin: ?  General: Skin is warm and dry.  ?Neurological:  ?   Mental Status: He is alert and oriented to person, place, and time.  ?Psychiatric:     ?   Mood and Affect: Mood normal.     ?   Behavior: Behavior normal.     ?   Thought Content: Thought content normal.     ?   Judgment: Judgment normal.  ? ? ?DG Ankle Complete Left ? ?Result Date: 03/31/2021 ?CLINICAL DATA:  Trauma, pain EXAM: LEFT ANKLE COMPLETE - 3+ VIEW COMPARISON:  None. FINDINGS: There are few smooth marginated calcifications adjacent to the tip of lateral malleolus. No recent fracture or dislocation is seen. IMPRESSION: No recent fracture or dislocation is seen in the left ankle. There are few smooth marginated calcifications adjacent to the tip of lateral malleolus possibly suggesting old avulsion fractures. Electronically Signed   By: Ernie Avena M.D.    On: 03/31/2021 11:17   ? ?Left ankle wrapped using 4" Ace wrap in figure-8 method.  ? ?Assessment and Plan :  ? ?PDMP not reviewed this encounter. ? ?1. Acute left ankle pain   ?2. Sprain of left ankle, unspecified ligament, initial encounter   ? ? Will manage for ankle sprain with rice method, NSAID. Counseled patient on potential for adverse effects with medications prescribed/recommended today, ER and return-to-clinic precautions discussed, patient verbalized understanding.  ?  ?Wallis Bamberg, PA-C ?03/31/21 1137 ? ?

## 2021-04-02 DIAGNOSIS — M9903 Segmental and somatic dysfunction of lumbar region: Secondary | ICD-10-CM | POA: Diagnosis not present

## 2021-04-02 DIAGNOSIS — M47812 Spondylosis without myelopathy or radiculopathy, cervical region: Secondary | ICD-10-CM | POA: Diagnosis not present

## 2021-04-02 DIAGNOSIS — M5442 Lumbago with sciatica, left side: Secondary | ICD-10-CM | POA: Diagnosis not present

## 2021-04-02 DIAGNOSIS — M9901 Segmental and somatic dysfunction of cervical region: Secondary | ICD-10-CM | POA: Diagnosis not present

## 2021-04-03 DIAGNOSIS — M47812 Spondylosis without myelopathy or radiculopathy, cervical region: Secondary | ICD-10-CM | POA: Diagnosis not present

## 2021-04-03 DIAGNOSIS — M9901 Segmental and somatic dysfunction of cervical region: Secondary | ICD-10-CM | POA: Diagnosis not present

## 2021-04-03 DIAGNOSIS — M9903 Segmental and somatic dysfunction of lumbar region: Secondary | ICD-10-CM | POA: Diagnosis not present

## 2021-04-03 DIAGNOSIS — M5442 Lumbago with sciatica, left side: Secondary | ICD-10-CM | POA: Diagnosis not present

## 2021-04-09 DIAGNOSIS — M5442 Lumbago with sciatica, left side: Secondary | ICD-10-CM | POA: Diagnosis not present

## 2021-04-09 DIAGNOSIS — M9903 Segmental and somatic dysfunction of lumbar region: Secondary | ICD-10-CM | POA: Diagnosis not present

## 2021-04-09 DIAGNOSIS — M47812 Spondylosis without myelopathy or radiculopathy, cervical region: Secondary | ICD-10-CM | POA: Diagnosis not present

## 2021-04-09 DIAGNOSIS — M9901 Segmental and somatic dysfunction of cervical region: Secondary | ICD-10-CM | POA: Diagnosis not present

## 2021-04-11 DIAGNOSIS — M9903 Segmental and somatic dysfunction of lumbar region: Secondary | ICD-10-CM | POA: Diagnosis not present

## 2021-04-11 DIAGNOSIS — M47812 Spondylosis without myelopathy or radiculopathy, cervical region: Secondary | ICD-10-CM | POA: Diagnosis not present

## 2021-04-11 DIAGNOSIS — M5442 Lumbago with sciatica, left side: Secondary | ICD-10-CM | POA: Diagnosis not present

## 2021-04-11 DIAGNOSIS — M9901 Segmental and somatic dysfunction of cervical region: Secondary | ICD-10-CM | POA: Diagnosis not present

## 2021-04-16 DIAGNOSIS — M5442 Lumbago with sciatica, left side: Secondary | ICD-10-CM | POA: Diagnosis not present

## 2021-04-16 DIAGNOSIS — M47812 Spondylosis without myelopathy or radiculopathy, cervical region: Secondary | ICD-10-CM | POA: Diagnosis not present

## 2021-04-16 DIAGNOSIS — M9903 Segmental and somatic dysfunction of lumbar region: Secondary | ICD-10-CM | POA: Diagnosis not present

## 2021-04-16 DIAGNOSIS — M9901 Segmental and somatic dysfunction of cervical region: Secondary | ICD-10-CM | POA: Diagnosis not present

## 2021-04-23 ENCOUNTER — Other Ambulatory Visit: Payer: Self-pay

## 2021-04-23 ENCOUNTER — Encounter: Payer: Self-pay | Admitting: Internal Medicine

## 2021-04-23 ENCOUNTER — Ambulatory Visit: Payer: 59 | Attending: Internal Medicine | Admitting: Internal Medicine

## 2021-04-23 VITALS — BP 120/73 | HR 85 | Resp 16 | Ht 70.0 in | Wt 148.8 lb

## 2021-04-23 DIAGNOSIS — M21372 Foot drop, left foot: Secondary | ICD-10-CM | POA: Diagnosis not present

## 2021-04-23 DIAGNOSIS — Z5320 Procedure and treatment not carried out because of patient's decision for unspecified reasons: Secondary | ICD-10-CM | POA: Diagnosis not present

## 2021-04-23 DIAGNOSIS — M5416 Radiculopathy, lumbar region: Secondary | ICD-10-CM

## 2021-04-23 DIAGNOSIS — H6123 Impacted cerumen, bilateral: Secondary | ICD-10-CM

## 2021-04-23 DIAGNOSIS — Z0000 Encounter for general adult medical examination without abnormal findings: Secondary | ICD-10-CM | POA: Diagnosis not present

## 2021-04-23 DIAGNOSIS — Z289 Immunization not carried out for unspecified reason: Secondary | ICD-10-CM

## 2021-04-23 DIAGNOSIS — F17200 Nicotine dependence, unspecified, uncomplicated: Secondary | ICD-10-CM | POA: Diagnosis not present

## 2021-04-23 DIAGNOSIS — Z1211 Encounter for screening for malignant neoplasm of colon: Secondary | ICD-10-CM | POA: Diagnosis not present

## 2021-04-23 DIAGNOSIS — Z28311 Partially vaccinated for covid-19: Secondary | ICD-10-CM | POA: Diagnosis not present

## 2021-04-23 DIAGNOSIS — M21371 Foot drop, right foot: Secondary | ICD-10-CM | POA: Diagnosis not present

## 2021-04-23 MED ORDER — DEBROX 6.5 % OT SOLN: 5.0000 [drp] | Freq: Two times a day (BID) | OTIC | 0 refills | Status: DC

## 2021-04-23 NOTE — Progress Notes (Signed)
? ? ?Patient ID: Jonathan LatRobert S Skipper, male    DOB: 21-Oct-1970  MRN: [id number] ? ?CC: Annual Exam ? ? ?Subjective: ?[name] is a 51 y.o. male who presents for annual exam ?His concerns today include:  ?Foot drop RT, lumbar radiculopathy ? ?Seen in UC 03/31/2021 for pain and swelling LT ankle after rolled LT ankle due to issue with foot drop. X-ray neg for acute fracture. Dx with sprain.   ?Reports today that the pain and swelling have resolve ?He still does not feel at this time that he would want to try a splint for his leg/legs ? ?In regards to lumbar radiculopathy symptoms and bilateral foot drop, he reports that he is doing much better .  In the past 2 to 3 weeks, pain 75% better meaning that it is more tolerable.  He stopped taking the gabapentin because it was causing some queasiness to his stomach.  Takes the tramadol intermittently depending on how he feels.  He has not had to use it in the past few weeks. ?He feels the the function in his feet is also getting better. ?Can walk dogs by himself now;  numbness on lateral aspect of RT lower leg is decreasing.  Initially from below knee down.  Now from lower 1/3 of lateral calf down ?"Every day I'm feeling better and walking better.  I have more stamina." ? ?Still seeing chiropractor 1-2x/wk ?Appointment with the neurosurgeon as scheduled for next month.  He thinks if he continues to improve he will cancel the appointment.  At this point he is satisfied enough with his progress that he would not want to have back surgery.   ?[facility].   ?Plans to get back to work.  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  Feels he has to get back to work otherwise he will lose his home..   ? ?Tob dep:  smoked since age 51.  Quit at age 51 for 5 yrs. Started smoking again 8 yrs ago after mom passed.   ?-smokes 1/2 a day.  Was a pk/day in his mid 30s. ?-wants to quit. Quit before by  reading a book called  Easy Way to Quit Smoking   Still has the book and plans to use it again.  Also considering hypnosis or acupuncture ? ?HM: no colon CA screening in past. No fhx of colon CA.  Had 2 COVID vaccines.  Does not want booster. Does not feel he is at risk for hep C, HIV, hep B.  Married x 20 yrs ? ? ?Patient Active Problem List  ? Diagnosis Date Noted  ? 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 Acute pain of left shoulder 06/17/2016  ?  ? ?Current Outpatient Medications on File Prior to Visit  ?Medication Sig Dispense Refill  ? gabapentin (NEURONTIN) 300 MG capsule Take 1 capsule (300 mg total) by mouth 2 (two) times daily. 60 capsule 4  ? meloxicam (MOBIC) 15 MG tablet Take 1 tablet (15 mg total) by mouth daily. 30 tablet 0  ? methocarbamol (ROBAXIN) 500 MG tablet Take 1 tablet (500 mg total) by mouth 2 (two) times daily as needed for muscle spasms (medication can cause drowsiness). (Patient not taking: Reported on 04/23/2021) 30 tablet 1  ? traMADol (ULTRAM) 50 MG tablet Take 1 tablet (50 mg total) by mouth every 6 (six) hours as needed. (Patient not taking: Reported on 03/20/2021) 40  tablet 0  ? traMADol (ULTRAM) 50 MG tablet Take 1 tablet (50 mg total) by mouth every 6 (six) hours. 120 tablet 0  ? ?No current facility-administered medications on file prior to visit.  ? ? ?No Known Allergies ? ?Social History  ? ?Socioeconomic History  ? Marital status: Married  ?  Spouse name: Not on file  ? Number of children: 1  ? Years of education: Not on file  ? Highest education level: Some college, no degree  ?Occupational History  ? 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 Smoking status: Every Day  ?  Packs/day: 0.50  ?  Types: Cigarettes  ? Smokeless tobacco: Never  ?Vaping Use  ? Vaping Use: Never used  ?Substance and Sexual Activity  ? Alcohol use: Yes  ? Drug use: Yes  ?  Types: Marijuana  ? Sexual activity: Not on file  ?Other Topics Concern  ? Not on file  ?Social History Narrative   ? Not on file  ? ?Social Determinants of Health  ? ?Financial Resource Strain: Not on file  ?Food Insecurity: Not on file  ?Transportation Needs: Not on file  ?Physical Activity: Not on file  ?Stress: Not on file  ?Social Connections: Not on file  ?Intimate Partner Violence: Not on file  ? ? ?Family History  ?Problem Relation Age of Onset  ? Breast cancer Mother   ? ? ?Past Surgical History:  ?Procedure Laterality Date  ? NO PAST SURGERIES    ? ? ?ROS: ?Review of Systems  ?Constitutional:   ?     Used to walk 3 miles a day before his acute back issue.  Now that his back is getting better, he is now up to 1 mile a day.  Overall feels he does well with his eating habits.  ?HENT:  Negative for congestion and sore throat.   ?     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  ?Eyes:   ?     Wears prescription lenses.  No complaints about vision at this time.  ?Respiratory:  Negative for cough, chest tightness and shortness of breath.   ?Cardiovascular:  Negative for chest pain.  ?Gastrointestinal:  Negative for blood in stool.  ?Genitourinary:  Negative for difficulty urinating.  ?Musculoskeletal:  Positive for gait problem.  ? ? ?PHYSICAL EXAM: ?BP 120/73   Pulse 85   Resp 16   Ht 5\' 10"  (1.778 m)   Wt 148 lb 12.8 oz (67.5 kg)   SpO2 98%   BMI 21.35 kg/m?   ?Wt Readings from Last 3 Encounters:  ?04/23/21 148 lb 12.8 oz (67.5 kg)  ?03/20/21 150 lb (68 kg)  ?03/02/21 150 lb (68 kg)  ? ? ?Physical Exam ? ?General appearance - alert, well appearing, and in no distress ?Mental status - normal mood, behavior, speech, dress, motor activity, and thought processes ?Eyes - pupils equal and reactive, extraocular eye movements intact ?Ears - moderate impacted hard wax in both canals obscuring view of TM ?Nose - normal and patent, no erythema, discharge or polyps ?Mouth - mucous membranes moist, pharynx normal without lesions ?Neck - supple, no significant adenopathy ?Lymphatics - no palpable lymphadenopathy, no  hepatosplenomegaly ?Chest - clear to auscultation, no wheezes, rales or rhonchi, symmetric air entry ?Heart - normal rate, regular rhythm, normal S1, S2, no murmurs, rubs, clicks or gallops ?Abdomen - soft, nontender, nondistended, no masses or organomegaly ?Neurological -decreased sensation to gross touch on the lateral  aspect of the right lower leg from the lower one third down.   ?Power in the lower extremities proximally and distally 5/5 bilaterally. ?Feet: Power on dorsi flexion 2/5 on the left, 3/5 on the right. ?Plantarflexion 3/5 bilaterally. ?Musculoskeletal -gait: Still noted to have mild foot drop bilaterally ?Extremities - peripheral pulses normal, no pedal edema, no clubbing or cyanosis ?Skin - normal coloration and turgor, no rashes, no suspicious skin lesions noted ? ?  04/23/2021  ? 10:39 AM 03/20/2021  ?  9:49 AM 01/05/2021  ?  3:52 PM  ?Depression screen PHQ 2/9  ?Decreased Interest 0 0 0  ?Down, Depressed, Hopeless 0 1 1  ?PHQ - 2 Score 0 1 1  ?Altered sleeping   0  ?Tired, decreased energy   2  ?Change in appetite   0  ?Feeling bad or failure about yourself    1  ?Trouble concentrating   1  ?Moving slowly or fidgety/restless   0  ?Suicidal thoughts   0  ?PHQ-9 Score   5  ? ? ? ?  Latest Ref Rng & Units 07/20/2015  ?  6:50 AM  ?CMP  ?Glucose 65 - 99 mg/dL 277    ?BUN 6 - 20 mg/dL 21    ?Creatinine 0.61 - 1.24 mg/dL 8.24    ?Sodium 135 - 145 mmol/L 135    ?Potassium 3.5 - 5.1 mmol/L 3.8    ?Chloride 101 - 111 mmol/L 105    ?CO2 22 - 32 mmol/L 21    ?Calcium 8.9 - 10.3 mg/dL 9.2    ?Total Protein 6.5 - 8.1 g/dL 6.7    ?Total Bilirubin 0.3 - 1.2 mg/dL 0.7    ?Alkaline Phos 38 - 126 U/L 53    ?AST 15 - 41 U/L 25    ?ALT 17 - 63 U/L 19    ? ?Lipid Panel  ?No results found for: CHOL, TRIG, HDL, CHOLHDL, VLDL, LDLCALC, LDLDIRECT ? ?CBC ?   ?Component Value Date/Time  ? WBC 10.9 (H) 07/20/2015 0650  ? WBC DUPLICATE REQUEST 07/20/2015 0650  ? RBC 4.59 07/20/2015 0650  ? RBC DUPLICATE REQUEST 07/20/2015 0650  ?  HGB 14.5 07/20/2015 0650  ? HGB DUPLICATE REQUEST 07/20/2015 0650  ? HCT 42.6 07/20/2015 0650  ? HCT DUPLICATE REQUEST 07/20/2015 0650  ? PLT 249 07/20/2015 0650  ? PLT DUPLICATE REQUEST 07/20/2015 0650

## 2021-04-23 NOTE — Patient Instructions (Signed)

## 2021-04-24 LAB — COMPREHENSIVE METABOLIC PANEL
ALT: 36 IU/L (ref 0–44)
AST: 44 IU/L — ABNORMAL HIGH (ref 0–40)
Albumin/Globulin Ratio: 2.1 (ref 1.2–2.2)
Albumin: 4.6 g/dL (ref 4.0–5.0)
Alkaline Phosphatase: 67 IU/L (ref 44–121)
BUN/Creatinine Ratio: 18 (ref 9–20)
BUN: 16 mg/dL (ref 6–24)
Bilirubin Total: 0.5 mg/dL (ref 0.0–1.2)
CO2: 24 mmol/L (ref 20–29)
Calcium: 9.6 mg/dL (ref 8.7–10.2)
Chloride: 100 mmol/L (ref 96–106)
Creatinine, Ser: 0.91 mg/dL (ref 0.76–1.27)
Globulin, Total: 2.2 g/dL (ref 1.5–4.5)
Glucose: 82 mg/dL (ref 70–99)
Potassium: 4.9 mmol/L (ref 3.5–5.2)
Sodium: 137 mmol/L (ref 134–144)
Total Protein: 6.8 g/dL (ref 6.0–8.5)
eGFR: 103 mL/min/{1.73_m2} (ref >59)

## 2021-04-24 LAB — LIPID PANEL
Chol/HDL Ratio: 2.8 ratio (ref 0.0–5.0)
Cholesterol, Total: 188 mg/dL (ref 100–199)
HDL: 67 mg/dL (ref >39)
LDL Chol Calc (NIH): 105 mg/dL — ABNORMAL HIGH (ref 0–99)
Triglycerides: 86 mg/dL (ref 0–149)
VLDL Cholesterol Cal: 16 mg/dL (ref 5–40)

## 2021-04-24 LAB — CBC
Hematocrit: 42.3 % (ref 37.5–51.0)
Hemoglobin: 14.7 g/dL (ref 13.0–17.7)
MCH: 32.5 pg (ref 26.6–33.0)
MCHC: 34.8 g/dL (ref 31.5–35.7)
MCV: 94 fL (ref 79–97)
Platelets: 240 10*3/uL (ref 150–450)
RBC: 4.52 x10E6/uL (ref 4.14–5.80)
RDW: 12.2 % (ref 11.6–15.4)
WBC: 9.3 10*3/uL (ref 3.4–10.8)

## 2021-05-14 ENCOUNTER — Ambulatory Visit: Payer: 59 | Admitting: Neurology

## 2021-05-25 DIAGNOSIS — M48062 Spinal stenosis, lumbar region with neurogenic claudication: Secondary | ICD-10-CM | POA: Diagnosis not present

## 2021-07-06 ENCOUNTER — Encounter: Payer: Self-pay | Admitting: Internal Medicine

## 2021-07-23 ENCOUNTER — Ambulatory Visit (AMBULATORY_SURGERY_CENTER): Payer: Self-pay | Admitting: *Deleted

## 2021-07-23 VITALS — Ht 70.0 in | Wt 152.0 lb

## 2021-07-23 DIAGNOSIS — Z1211 Encounter for screening for malignant neoplasm of colon: Secondary | ICD-10-CM

## 2021-07-23 MED ORDER — NA SULFATE-K SULFATE-MG SULF 17.5-3.13-1.6 GM/177ML PO SOLN: 1.0000 | Freq: Once | ORAL | 0 refills | Status: AC

## 2021-07-23 NOTE — Progress Notes (Signed)
No egg or soy allergy known to patient  No issues known to pt with past sedation with any surgeries or procedures Patient denies ever being told they had issues or difficulty with intubation  No FH of Malignant Hyperthermia Pt is not on diet pills Pt is not on  home 02  Pt is not on blood thinners  Pt denies issues with constipation  No A fib or A flutter    NO PA's for preps discussed with pt In PV today  Discussed with pt there will be an out-of-pocket cost for prep and that varies from $0 to 70 +  dollars - pt verbalized understanding  Pt instructed to use Singlecare.com or GoodRx for a price reduction on prep  

## 2021-07-27 ENCOUNTER — Encounter: Payer: Self-pay | Admitting: Internal Medicine

## 2021-08-06 ENCOUNTER — Encounter: Payer: Self-pay | Admitting: Internal Medicine

## 2021-08-06 ENCOUNTER — Telehealth: Payer: Self-pay | Admitting: Internal Medicine

## 2021-08-06 ENCOUNTER — Ambulatory Visit (AMBULATORY_SURGERY_CENTER): Payer: 59 | Admitting: Internal Medicine

## 2021-08-06 VITALS — BP 112/58 | HR 63 | Temp 97.5°F | Resp 17 | Ht 70.0 in | Wt 150.0 lb

## 2021-08-06 DIAGNOSIS — Z1211 Encounter for screening for malignant neoplasm of colon: Secondary | ICD-10-CM

## 2021-08-06 DIAGNOSIS — R69 Illness, unspecified: Secondary | ICD-10-CM | POA: Diagnosis not present

## 2021-08-06 MED ORDER — SODIUM CHLORIDE 0.9 % IV SOLN: 500.0000 mL | Freq: Once | INTRAVENOUS | Status: DC

## 2021-08-06 NOTE — Progress Notes (Signed)
Cell phone off per pt Pt's states no medical or surgical changes since previsit or office visit.  

## 2021-08-06 NOTE — Progress Notes (Signed)
Sedate, gd SR, tolerated procedure well, VSS, report to RN 

## 2021-08-06 NOTE — Telephone Encounter (Signed)
Spoke with pt- he is aware that he can Jonathan Rasmussen and stated his wife will be here right at 3:00.  He will come at 2:00 pm and is aware we will take him back for procedure when we are sure his wife is here.  Understanding voiced.

## 2021-08-06 NOTE — Progress Notes (Signed)
HISTORY OF PRESENT ILLNESS:  Jonathan Rasmussen is a 51 y.o. male who presents today for routine screening colonoscopy.  No complaints  REVIEW OF SYSTEMS:  All non-GI ROS negative. Past Medical History:  Diagnosis Date   Allergy    Anxiety    Arthritis    back, hands   Neuromuscular disorder (HCC)    sciatica with back pain    Past Surgical History:  Procedure Laterality Date   MOUTH SURGERY     with sedation    Social History Jonathan Rasmussen  reports that he has been smoking cigarettes. He has been smoking an average of .25 packs per day. He has never used smokeless tobacco. He reports that he does not currently use alcohol. He reports current drug use. Drug: Marijuana.  family history includes Breast cancer in his mother.  No Known Allergies     PHYSICAL EXAMINATION:  Vital signs: BP 120/71   Pulse 70   Temp (!) 97.5 F (36.4 C)   Ht 5\' 10"  (1.778 m)   Wt 150 lb (68 kg)   SpO2 99%   BMI 21.52 kg/m  General: Well-developed, well-nourished, no acute distress HEENT: Sclerae are anicteric, conjunctiva pink. Oral mucosa intact Lungs: Clear Heart: Regular Abdomen: soft, nontender, nondistended, no obvious ascites, no peritoneal signs, normal bowel sounds. No organomegaly. Extremities: No edema Psychiatric: alert and oriented x3. Cooperative     ASSESSMENT:   Colon cancer screening  PLAN:   Screening colonoscopy

## 2021-08-06 NOTE — Telephone Encounter (Signed)
Inbound call from patient stating that he has a colonoscopy with Dr. Marina Goodell at 3:00. Patient stated that his wife is going to be his care giver but will not be able to get here until 3:00. Patient is seeking advice if he can Jonathan Rasmussen here and then whenever she gets here the procedure can start and she will take him home. Patient is requesting a call back to discuss. Please advise.

## 2021-08-06 NOTE — Patient Instructions (Signed)
Handouts provided for diverticulosis and hemorrhoids.  Repeat colonoscopy in 10 years.   YOU HAD AN ENDOSCOPIC PROCEDURE TODAY AT THE [facility]:   Refer to the procedure report that was given to you for any specific questions about what was found during the examination.  If the procedure report does not answer your questions, please call your gastroenterologist to clarify.  If you requested that your care partner not be given the details of your procedure findings, then the procedure report has been included in a sealed envelope for you to review at your convenience later.  YOU SHOULD EXPECT: Some feelings of bloating in the abdomen. Passage of more gas than usual.  Walking can help get rid of the air that was put into your GI tract during the procedure and reduce the bloating. If you had a lower endoscopy (such as a colonoscopy or flexible sigmoidoscopy) you may notice spotting of blood in your stool or on the toilet paper. If you underwent a bowel prep for your procedure, you may not have a normal bowel movement for a few days.  Please Note:  You might notice some irritation and congestion in your nose or some drainage.  This is from the oxygen used during your procedure.  There is no need for concern and it should clear up in a day or so.  SYMPTOMS TO REPORT IMMEDIATELY:  Following lower endoscopy (colonoscopy or flexible sigmoidoscopy):  Excessive amounts of blood in the stool  Significant tenderness or worsening of abdominal pains  Swelling of the abdomen that is new, acute  Fever of 100F or higher   For urgent or emergent issues, a gastroenterologist can be reached at any hour by calling [phone]. Do not use MyChart messaging for urgent concerns.    DIET:  We do recommend a small meal at first, but then you may proceed to your regular diet.  Drink plenty of fluids but you should avoid alcoholic beverages for 24 hours.  ACTIVITY:  You should plan to take it easy for  the rest of today and you should NOT DRIVE or use heavy machinery until tomorrow (because of the sedation medicines used during the test).    FOLLOW UP: Our staff will call the number listed on your records the next business day following your procedure.  We will call around 7:15- 8:00 am to check on you and address any questions or concerns that you may have regarding the information given to you following your procedure. If we do not reach you, we will leave a message.  If you develop any symptoms (ie: fever, flu-like symptoms, shortness of breath, cough etc.) before then, please call [phone].  If you test positive for Covid 19 in the 2 weeks post procedure, please call and report this information to [place].    If any biopsies were taken you will be contacted by phone or by letter within the next 1-3 weeks.  Please call us at [phone] if you have not heard about the biopsies in 3 weeks.    SIGNATURES/CONFIDENTIALITY: You and/or your care partner have signed paperwork which will be entered into your electronic medical record.  These signatures attest to the fact that that the information above on your After Visit Summary has been reviewed and is understood.  Full responsibility of the confidentiality of this discharge information lies with you and/or your care-partner.

## 2021-08-06 NOTE — Op Note (Signed)
[facility] Patient Name: Jonathan Rasmussen Procedure Date: 08/06/2021 3:26 PM MRN: [id number] Endoscopist: Wilhemina Bonito. Marina Goodell , MD Age: 51 Referring MD:  Date of Birth: [birth year] Gender: Male Account #: [id number] Procedure:                Colonoscopy Indications:              Screening for colorectal malignant neoplasm Medicines:                Monitored Anesthesia Care Procedure:                Pre-Anesthesia Assessment:                           - Prior to the procedure, a History and Physical                            was performed, and patient medications and                            allergies were reviewed. The patient's tolerance of                            previous anesthesia was also reviewed. The risks                            and benefits of the procedure and the sedation                            options and risks were discussed with the patient.                            All questions were answered, and informed consent                            was obtained. Prior Anticoagulants: The patient has                            taken no previous anticoagulant or antiplatelet                            agents. ASA Grade Assessment: II - A patient with                            mild systemic disease. After reviewing the risks                            and benefits, the patient was deemed in                            satisfactory condition to undergo the procedure.                           After obtaining informed consent, the colonoscope  was passed under direct vision. Throughout the                            procedure, the patient's blood pressure, pulse, and                            oxygen saturations were monitored continuously. The                            Olympus CF-HQ190L [id number]) Colonoscope was                            introduced through the anus and advanced to the the                            cecum, identified by  appendiceal orifice and                            ileocecal valve. The ileocecal valve, appendiceal                            orifice, and rectum were photographed. The quality                            of the bowel preparation was excellent. The                            colonoscopy was performed without difficulty. The                            patient tolerated the procedure well. The bowel                            preparation used was SUPREP via split dose                            instruction. Scope In: 3:34:54 PM Scope Out: 3:48:03 PM Scope Withdrawal Time: 0 hours 9 minutes 35 seconds  Total Procedure Duration: 0 hours 13 minutes 9 seconds  Findings:                 A few small-mouthed diverticula were found in the                            sigmoid colon and ascending colon.                           Internal hemorrhoids were found during                            retroflexion. The hemorrhoids were moderate.                           The exam was otherwise without abnormality on  direct and retroflexion views. Complications:            No immediate complications. Estimated blood loss:                            None. Estimated Blood Loss:     Estimated blood loss: none. Impression:               - Internal hemorrhoids.                           - Diverticulosis                           - The examination was otherwise normal on direct                            and retroflexion views.                           - No specimens collected. Recommendation:           - Repeat colonoscopy in 10 years for screening                            purposes.                           - Patient has a contact number available for                            emergencies. The signs and symptoms of potential                            delayed complications were discussed with the                            patient. Return to normal activities tomorrow.                             Written discharge instructions were provided to the                            patient.                           - Resume previous diet.                           - Continue present medications. Wilhemina Bonito. Marina Goodell, MD 08/06/2021 3:53:13 PM This report has been signed electronically.

## 2021-08-07 ENCOUNTER — Telehealth: Payer: Self-pay

## 2021-08-07 NOTE — Telephone Encounter (Signed)
  Follow up Call-     08/06/2021    2:48 PM  Call back number  Post procedure Call Back phone  # [phone]  Permission to leave phone message Yes     Patient questions:  Do you have a fever, pain , or abdominal swelling? No. Pain Score  0 *  Have you tolerated food without any problems? Yes.    Have you been able to return to your normal activities? Yes.    Do you have any questions about your discharge instructions: Diet   No. Medications  No. Follow up visit  No.  Do you have questions or concerns about your Care? No.  Actions: * If pain score is 4 or above: No action needed, pain <4.

## 2021-08-27 ENCOUNTER — Other Ambulatory Visit: Payer: Self-pay

## 2021-08-27 ENCOUNTER — Emergency Department (HOSPITAL_BASED_OUTPATIENT_CLINIC_OR_DEPARTMENT_OTHER)
Admission: EM | Admit: 2021-08-27 | Discharge: 2021-08-27 | Disposition: A | Discharge status: Home / Self Care | Payer: 59 | Attending: Emergency Medicine | Admitting: Emergency Medicine

## 2021-08-27 ENCOUNTER — Emergency Department (HOSPITAL_BASED_OUTPATIENT_CLINIC_OR_DEPARTMENT_OTHER): Payer: 59 | Admitting: Radiology

## 2021-08-27 ENCOUNTER — Encounter (HOSPITAL_BASED_OUTPATIENT_CLINIC_OR_DEPARTMENT_OTHER): Payer: Self-pay | Admitting: Emergency Medicine

## 2021-08-27 DIAGNOSIS — M25512 Pain in left shoulder: Secondary | ICD-10-CM | POA: Diagnosis not present

## 2021-08-27 DIAGNOSIS — M25522 Pain in left elbow: Secondary | ICD-10-CM | POA: Diagnosis not present

## 2021-08-27 DIAGNOSIS — S4992XA Unspecified injury of left shoulder and upper arm, initial encounter: Secondary | ICD-10-CM | POA: Insufficient documentation

## 2021-08-27 DIAGNOSIS — S51012A Laceration without foreign body of left elbow, initial encounter: Secondary | ICD-10-CM | POA: Insufficient documentation

## 2021-08-27 DIAGNOSIS — Y92830 Public park as the place of occurrence of the external cause: Secondary | ICD-10-CM | POA: Insufficient documentation

## 2021-08-27 DIAGNOSIS — S59902A Unspecified injury of left elbow, initial encounter: Secondary | ICD-10-CM | POA: Diagnosis not present

## 2021-08-27 DIAGNOSIS — T1490XA Injury, unspecified, initial encounter: Secondary | ICD-10-CM | POA: Diagnosis not present

## 2021-08-27 MED ORDER — HYDROCODONE-ACETAMINOPHEN 5-325 MG PO TABS
2.0000 | ORAL_TABLET | Freq: Once | ORAL | Status: AC
  Administered 2021-08-27: 2 via ORAL
  Filled 2021-08-27: qty 2

## 2021-08-27 MED ORDER — HYDROCODONE-ACETAMINOPHEN 5-325 MG PO TABS
1.0000 | ORAL_TABLET | Freq: Once | ORAL | Status: AC
  Administered 2021-08-27: 1 via ORAL
  Filled 2021-08-27: qty 1

## 2021-08-27 MED ORDER — CEPHALEXIN 500 MG PO CAPS: 500.0000 mg | ORAL_CAPSULE | Freq: Four times a day (QID) | ORAL | 0 refills | Status: AC

## 2021-08-27 MED ORDER — HYDROCODONE-ACETAMINOPHEN 5-325 MG PO TABS: 1.0000 | ORAL_TABLET | Freq: Four times a day (QID) | ORAL | 0 refills | Status: DC | PRN

## 2021-08-27 MED ORDER — LIDOCAINE-EPINEPHRINE 2 %-1:200000 IJ SOLN
20.0000 mL | Freq: Once | INTRAMUSCULAR | Status: AC
Start: 2021-08-27 — End: 2021-08-27
  Administered 2021-08-27: 20 mL
  Filled 2021-08-27: qty 20

## 2021-08-27 NOTE — ED Notes (Signed)
This tech cleaned wound on left elbow with wound cleaner, non adhesive bandage applied and wrapped with coban per [employer].

## 2021-08-27 NOTE — Discharge Instructions (Addendum)
Diagnosed with left elbow laceration and left shoulder pain related to suspected should dislocation. Wound is well approximated with 7 stiches. Advise to keep dry over night and recommend gentle washing with soap and water tomorrow and moving forward. Please follow up with PCP for stiches removal in 5 to 7 days. Prescribed 7 day course of keflex for empiric antibiotic coverage given how dirty the wound was on inspection. Also referred you to an orthopedic doctor for follow up regarding possible shoulder. dislocation. Please schedule appointment ASAP. Presecribed two days of Norco/Vicadin for pain. Also recommend tylenol and advil for pain as needed.
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
## 2021-08-29 ENCOUNTER — Ambulatory Visit: Payer: Self-pay

## 2021-08-29 DIAGNOSIS — Z23 Encounter for immunization: Secondary | ICD-10-CM | POA: Diagnosis not present
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
## 2021-08-30 NOTE — Telephone Encounter (Signed)
Called patient he stated that a nurse look and gave advice on the wound and now is doing better and he also stated that [facility] went to [facility] and got [facility] shot.

## 2021-08-31 DIAGNOSIS — S43005A Unspecified dislocation of left shoulder joint, initial encounter: Secondary | ICD-10-CM | POA: Diagnosis not present

## 2021-09-04 ENCOUNTER — Encounter: Payer: Self-pay | Admitting: Critical Care Medicine

## 2021-09-04 ENCOUNTER — Ambulatory Visit: Payer: 59 | Attending: Critical Care Medicine | Admitting: Critical Care Medicine

## 2021-09-04 ENCOUNTER — Ambulatory Visit
Admission: RE | Admit: 2021-09-04 | Discharge: 2021-09-04 | Disposition: A | Discharge status: Home / Self Care | Payer: 59 | Source: Ambulatory Visit | Attending: Critical Care Medicine | Admitting: Critical Care Medicine

## 2021-09-04 VITALS — BP 133/77 | HR 82 | Temp 99.5°F | Resp 14 | Ht 70.0 in | Wt 152.0 lb

## 2021-09-04 DIAGNOSIS — S51012S Laceration without foreign body of left elbow, sequela: Secondary | ICD-10-CM | POA: Diagnosis not present

## 2021-09-04 DIAGNOSIS — S79912D Unspecified injury of left hip, subsequent encounter: Secondary | ICD-10-CM

## 2021-09-04 DIAGNOSIS — S4992XD Unspecified injury of left shoulder and upper arm, subsequent encounter: Secondary | ICD-10-CM | POA: Diagnosis not present

## 2021-09-04 DIAGNOSIS — M47816 Spondylosis without myelopathy or radiculopathy, lumbar region: Secondary | ICD-10-CM | POA: Diagnosis not present

## 2021-09-04 DIAGNOSIS — S7002XA Contusion of left hip, initial encounter: Secondary | ICD-10-CM | POA: Diagnosis not present

## 2021-09-04 DIAGNOSIS — S79912A Unspecified injury of left hip, initial encounter: Secondary | ICD-10-CM | POA: Insufficient documentation

## 2021-09-04 DIAGNOSIS — S4992XA Unspecified injury of left shoulder and upper arm, initial encounter: Secondary | ICD-10-CM | POA: Insufficient documentation

## 2021-09-04 MED ORDER — TRAMADOL HCL 50 MG PO TABS: 50.0000 mg | ORAL_TABLET | Freq: Four times a day (QID) | ORAL | 0 refills | Status: AC | PRN

## 2021-09-04 NOTE — Patient Instructions (Signed)
Take tramadol every 6 hours as needed for severe pain. Sent to [facility] pharmacy   Take 1000 mg tylenol every 8 hours, consistently, for pain control   Keep MRI appointment 08/23 and follow up with orthopedics after regarding left shoulder  Xray of left hip ordered, we will call you with results. Please discuss this with orthopedics as well  Follow up Dr Laural Benes 4 months

## 2021-09-04 NOTE — Assessment & Plan Note (Signed)
Stitches removed in office today Laceration healing very nicely, no signs of infection or hematoma

## 2021-09-04 NOTE — Assessment & Plan Note (Addendum)
Left hip Xray ordered today  Advised patient to also bring this concern up to Dr Dion Saucier at his next follow up

## 2021-09-04 NOTE — Assessment & Plan Note (Addendum)
Currently followed by Dr Dion Saucier with Eul[facility] Pont and [facility] Has upcoming MRI scheduled 08/23 Tramadol script sent today for severe pain exacerbations short-term 5-day prescription only #20 PDMP database checked
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
## 2021-09-05 ENCOUNTER — Telehealth: Payer: Self-pay

## 2021-09-05 NOTE — Telephone Encounter (Signed)
Copied from CRM [id number]. Topic: General - Other >> Sep 04, 2021  5:56 PM Dondra Prader E wrote: Reason for CRM: Pt called reporting that the pharmacy told him that a PA is needed for this medication please advise

## 2021-09-05 NOTE — Telephone Encounter (Signed)
Can you call this pharmacy to tell them I authorized early refill on this pt tramadol?

## 2021-09-05 NOTE — Telephone Encounter (Signed)
Called patient's pharmacy and left a message with them. Per Dr. Lynelle Doctor note , he stated that they can fill his tramadol early.

## 2021-09-19 ENCOUNTER — Encounter: Payer: Self-pay | Admitting: Internal Medicine

## 2021-09-19 DIAGNOSIS — M25512 Pain in left shoulder: Secondary | ICD-10-CM | POA: Diagnosis not present

## 2021-09-28 DIAGNOSIS — S46012A Strain of muscle(s) and tendon(s) of the rotator cuff of left shoulder, initial encounter: Secondary | ICD-10-CM | POA: Diagnosis not present

## 2021-09-28 DIAGNOSIS — S43005D Unspecified dislocation of left shoulder joint, subsequent encounter: Secondary | ICD-10-CM | POA: Diagnosis not present

## 2021-10-12 DIAGNOSIS — S46012D Strain of muscle(s) and tendon(s) of the rotator cuff of left shoulder, subsequent encounter: Secondary | ICD-10-CM | POA: Diagnosis not present

## 2021-10-12 DIAGNOSIS — M6281 Muscle weakness (generalized): Secondary | ICD-10-CM | POA: Diagnosis not present

## 2021-10-12 DIAGNOSIS — M25612 Stiffness of left shoulder, not elsewhere classified: Secondary | ICD-10-CM | POA: Diagnosis not present

## 2021-10-15 ENCOUNTER — Encounter: Payer: Self-pay | Admitting: Internal Medicine

## 2021-10-22 ENCOUNTER — Encounter: Payer: Self-pay | Admitting: Behavioral Health

## 2021-10-22 ENCOUNTER — Telehealth: Payer: Self-pay | Admitting: Behavioral Health

## 2021-10-22 ENCOUNTER — Ambulatory Visit: Payer: 59 | Admitting: Behavioral Health

## 2021-10-22 VITALS — BP 113/82 | HR 68 | Ht 70.0 in | Wt 150.0 lb

## 2021-10-22 DIAGNOSIS — F411 Generalized anxiety disorder: Secondary | ICD-10-CM

## 2021-10-22 DIAGNOSIS — F331 Major depressive disorder, recurrent, moderate: Secondary | ICD-10-CM | POA: Diagnosis not present

## 2021-10-22 DIAGNOSIS — R69 Illness, unspecified: Secondary | ICD-10-CM | POA: Diagnosis not present

## 2021-10-22 MED ORDER — ESCITALOPRAM OXALATE 10 MG PO TABS: 10.0000 mg | ORAL_TABLET | Freq: Every day | ORAL | 1 refills | Status: DC

## 2021-10-22 NOTE — Telephone Encounter (Signed)
Ok, thanks, I will note that. Thank you.
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
## 2021-10-22 NOTE — Telephone Encounter (Signed)
Pt was in for a visit today with Aaron Edelman.  He said he forgot to tell him some additional info.  He would like Aaron Edelman to call him back.  Next appt 10/23

## 2021-10-22 NOTE — Telephone Encounter (Signed)
Patient said he forgot to mention in his appt today that in 2016 his wife had a stillborn and in June 2021 they lost a child at birth. He said he didn't think it would change the treatment prescribed, but he wanted you to know. I saw in notes that he saw Gerald Stabs in 2021 and asked him if he would consider restarting therapy with Gerald Stabs and he said he could not afford it right now, but that is something he would like to pursue eventually. Just an FYI.

## 2021-11-02 ENCOUNTER — Other Ambulatory Visit: Payer: Self-pay

## 2021-11-02 ENCOUNTER — Ambulatory Visit: Payer: 59 | Attending: Internal Medicine | Admitting: Internal Medicine

## 2021-11-02 ENCOUNTER — Encounter: Payer: Self-pay | Admitting: Internal Medicine

## 2021-11-02 VITALS — BP 118/78 | HR 73 | Temp 98.0°F | Ht 70.0 in | Wt 148.8 lb

## 2021-11-02 DIAGNOSIS — F17200 Nicotine dependence, unspecified, uncomplicated: Secondary | ICD-10-CM

## 2021-11-02 DIAGNOSIS — Z01818 Encounter for other preprocedural examination: Secondary | ICD-10-CM

## 2021-11-02 DIAGNOSIS — F17210 Nicotine dependence, cigarettes, uncomplicated: Secondary | ICD-10-CM | POA: Diagnosis not present

## 2021-11-02 DIAGNOSIS — M5416 Radiculopathy, lumbar region: Secondary | ICD-10-CM | POA: Insufficient documentation

## 2021-11-02 DIAGNOSIS — Z79899 Other long term (current) drug therapy: Secondary | ICD-10-CM | POA: Diagnosis not present

## 2021-11-02 DIAGNOSIS — F411 Generalized anxiety disorder: Secondary | ICD-10-CM | POA: Insufficient documentation

## 2021-11-02 DIAGNOSIS — F32A Depression, unspecified: Secondary | ICD-10-CM | POA: Diagnosis not present

## 2021-11-02 DIAGNOSIS — R69 Illness, unspecified: Secondary | ICD-10-CM | POA: Diagnosis not present
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
## 2021-11-03 LAB — BASIC METABOLIC PANEL
BUN/Creatinine Ratio: 19 (ref 9–20)
BUN: 14 mg/dL (ref 6–24)
CO2: 23 mmol/L (ref 20–29)
Calcium: 9.7 mg/dL (ref 8.7–10.2)
Chloride: 101 mmol/L (ref 96–106)
Creatinine, Ser: 0.72 mg/dL — ABNORMAL LOW (ref 0.76–1.27)
Glucose: 82 mg/dL (ref 70–99)
Potassium: 4.6 mmol/L (ref 3.5–5.2)
Sodium: 139 mmol/L (ref 134–144)
eGFR: 111 mL/min/{1.73_m2} (ref >59)

## 2021-11-06 ENCOUNTER — Ambulatory Visit: Payer: 59 | Attending: Cardiovascular Disease | Admitting: Cardiovascular Disease

## 2021-11-06 ENCOUNTER — Encounter: Payer: Self-pay | Admitting: Cardiovascular Disease

## 2021-11-06 DIAGNOSIS — Z8249 Family history of ischemic heart disease and other diseases of the circulatory system: Secondary | ICD-10-CM | POA: Diagnosis not present

## 2021-11-06 DIAGNOSIS — E785 Hyperlipidemia, unspecified: Secondary | ICD-10-CM | POA: Insufficient documentation

## 2021-11-06 DIAGNOSIS — E782 Mixed hyperlipidemia: Secondary | ICD-10-CM

## 2021-11-06 DIAGNOSIS — Z01818 Encounter for other preprocedural examination: Secondary | ICD-10-CM | POA: Diagnosis not present

## 2021-11-06 DIAGNOSIS — Z720 Tobacco use: Secondary | ICD-10-CM | POA: Diagnosis not present

## 2021-11-06 NOTE — Progress Notes (Addendum)
11/06/2021 Jonathan Rasmussen   [birth year]  [id number]  Primary Physician Jonathan Pier, MD Primary Cardiologist: Jonathan Harp MD Jonathan Rasmussen, [place]  HPI:  Jonathan Rasmussen is a 51 y.o. 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  He was referred by his PCP, Jonathan Rasmussen, for preoperative clearance prior to elective left rotator cuff surgery.  His risk factors include tobacco abuse smoking one quarter of a pack per day having smoked for 30 years.  He has only mild hyperlipidemia.  His father did have stents although he has  never had heart attack or stroke.  He denies chest pain or shortness of breath.  He walks about 1.5 miles a day with his dogs.  He had an EKG performed that showed septal Q waves for which she was referred for preoperative clearance.   Current Meds  Medication Sig   escitalopram (LEXAPRO) 10 MG tablet Take 1 tablet (10 mg total) by mouth daily.     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, multimedia  Tobacco Use   Smoking status: Every Day    Packs/day: 0.25    Types: Cigarettes   Smokeless tobacco: Never  Vaping Use   Vaping Use: Never used  Substance and Sexual Activity   Alcohol use: Not Currently    Comment: none in 2 yrs   Drug use: Yes    Types: Marijuana    Comment: last used 08-05-21   Sexual activity: Yes  Other Topics Concern   Not on file  Social History Narrative   Not on file   Social Determinants of Health   Financial Resource Strain: Not on file  Food Insecurity: Not on file  Transportation Needs: Not on file  Physical Activity: Not on  file  Stress: Not on file  Social Connections: Not on file  Intimate Partner Violence: Not on file     Review of Systems: General: negative for chills, fever, night sweats or weight changes.  Cardiovascular: negative for chest pain, dyspnea on exertion, edema, orthopnea, palpitations, paroxysmal nocturnal dyspnea or shortness of breath Dermatological: negative for rash Respiratory: negative for cough or wheezing Urologic: negative for hematuria Abdominal: negative for nausea, vomiting, diarrhea, bright red blood per rectum, melena, or hematemesis Neurologic: negative for visual changes, syncope, or dizziness All other systems reviewed and are otherwise negative except as noted above.    Blood pressure 130/78, pulse 82, weight 149 lb 12.8 oz (67.9 kg), SpO2 98 %.  General appearance: alert and no distress Neck: no adenopathy, no carotid bruit, no JVD, supple, symmetrical, trachea midline, and thyroid not enlarged, symmetric, no tenderness/mass/nodules Lungs: clear to auscultation bilaterally Heart: regular rate and rhythm, S1, S2 normal, no murmur, click, rub or gallop Extremities: extremities normal, atraumatic, no cyanosis or edema Pulses: 2+ and symmetric Skin: Skin color, texture, turgor normal. No rashes or lesions Neurologic: Grossly normal  EKG not performed today  ASSESSMENT AND PLAN:   Tobacco abuse Ongoing tobacco abuse of a quarter of a pack per day.  He smoked for the last 30 years.  Family  history of heart disease Father had stents  Hyperlipidemia His most recent lipid profile performed 04/23/2021 revealed total cholesterol 188, LDL 105 and HDL of 67.  Close to being at goal for primary prevention.  Preoperative clearance Jonathan Rasmussen was referred to me by Dr. Laural Benes for preoperative clearance before left shoulder surgery for rotator cuff tear.  He was referred because of an abnormal EKG.  His risk factors include tobacco abuse and family history of heart disease.   Never had heart attack or stroke.  He denies chest pain or shortness of breath.  His EKG does show septal Q waves however.  I am going to get a 2D echocardiogram for LV function and wall motion as well as a coronary calcium score.  If these are unremarkable we will clear him for his upcoming surgery at low risk.     Runell Gess MD FACP,FACC,FAHA, [facility] 11/06/2021 3:10 PM

## 2021-11-06 NOTE — Assessment & Plan Note (Signed)
His most recent lipid profile performed 04/23/2021 revealed total cholesterol 188, LDL 105 and HDL of 67.  Close to being at goal for primary prevention.

## 2021-11-06 NOTE — Assessment & Plan Note (Signed)
Ongoing tobacco abuse of a quarter of a pack per day.  He smoked for the last 30 years.

## 2021-11-06 NOTE — Assessment & Plan Note (Signed)
Father had stents

## 2021-11-06 NOTE — Patient Instructions (Addendum)
Medication Instructions:  Your physician recommends that you continue on your current medications as directed. Please refer to the Current Medication list given to you today.  *If you need a refill on your cardiac medications before your next appointment, please call your pharmacy*  Testing/Procedures: Your physician has requested that you have an echocardiogram. Echocardiography is a painless test that uses sound waves to create images of your heart. It provides your doctor with information about the size and shape of your heart and how well your heart's chambers and valves are working. This procedure takes approximately one hour. There are no restrictions for this procedure.  CT coronary calcium score.   Test locations:  [facility]   This is $99 out of pocket.   Coronary CalciumScan A coronary calcium scan is an imaging test used to look for deposits of calcium and other fatty materials (plaques) in the inner lining of the blood vessels of the heart (coronary arteries). These deposits of calcium and plaques can partly clog and narrow the coronary arteries without producing any symptoms or warning signs. This puts a person at risk for a heart attack. This test can detect these deposits before symptoms develop. Tell a health care provider about: Any allergies you have. All medicines you are taking, including vitamins, herbs, eye drops, creams, and over-the-counter medicines. Any problems you or family members have had with anesthetic medicines. Any blood disorders you have. Any surgeries you have had. Any medical conditions you have. Whether you are pregnant or may be pregnant. What are the risks? Generally, this is a safe procedure. However, problems may occur, including: Harm to a pregnant woman and her unborn baby. This test involves the use of radiation. Radiation exposure can be dangerous to a pregnant woman and her unborn baby. If you are pregnant,  you generally should not have this procedure done. Slight increase in the risk of cancer. This is because of the radiation involved in the test. What happens before the procedure? No preparation is needed for this procedure. What happens during the procedure? You will undress and remove any jewelry around your neck or chest. You will put on a hospital gown. Sticky electrodes will be placed on your chest. The electrodes will be connected to an electrocardiogram (ECG) machine to record a tracing of the electrical activity of your heart. A CT scanner will take pictures of your heart. During this time, you will be asked to lie still and hold your breath for 2-3 seconds while a picture of your heart is being taken. The procedure may vary among health care providers and hospitals. What happens after the procedure? You can get dressed. You can return to your normal activities. It is up to you to get the results of your test. Ask your health care provider, or the department that is doing the test, when your results will be ready. Summary A coronary calcium scan is an imaging test used to look for deposits of calcium and other fatty materials (plaques) in the inner lining of the blood vessels of the heart (coronary arteries). Generally, this is a safe procedure. Tell your health care provider if you are pregnant or may be pregnant. No preparation is needed for this procedure. A CT scanner will take pictures of your heart. You can return to your normal activities after the scan is done. This information is not intended to replace advice given to you by your health care provider. Make sure you discuss any questions you  have with your health care provider. Document Released: 07/13/2007 Document Revised: 12/04/2015 Document Reviewed: 12/04/2015 Elsevier Interactive Patient Education  2017 [employer].  Follow-Up: At [facility], you and your health needs are our priority.  As part of our  continuing mission to provide you with exceptional heart care, we have created designated Provider Care Teams.  These Care Teams include your primary Cardiologist (physician) and Advanced Practice Providers (APPs -  Physician Assistants and Nurse Practitioners) who all work together to provide you with the care you need, when you need it.  We recommend signing up for the patient portal called "MyChart".  Sign up information is provided on this After Visit Summary.  MyChart is used to connect with patients for Virtual Visits (Telemedicine).  Patients are able to view lab/test results, encounter notes, upcoming appointments, etc.  Non-urgent messages can be sent to your provider as well.   To learn more about what you can do with MyChart, go to [email or web address].    Your next appointment:   AS NEEDED with Dr. Allyson Sabal

## 2021-11-06 NOTE — Assessment & Plan Note (Signed)
Jonathan Rasmussen was referred to me by Dr. Wynetta Emery for preoperative clearance before left shoulder surgery for rotator cuff tear.  He was referred because of an abnormal EKG.  His risk factors include tobacco abuse and family history of heart disease.  Never had heart attack or stroke.  He denies chest pain or shortness of breath.  His EKG does show septal Q waves however.  I am going to get a 2D echocardiogram for LV function and wall motion as well as a coronary calcium score.  If these are unremarkable we will clear him for his upcoming surgery at low risk.

## 2021-11-07 ENCOUNTER — Other Ambulatory Visit: Payer: Self-pay | Admitting: Internal Medicine

## 2021-11-07 ENCOUNTER — Encounter: Payer: Self-pay | Admitting: Internal Medicine

## 2021-11-07 ENCOUNTER — Encounter: Payer: Self-pay | Admitting: Cardiovascular Disease

## 2021-11-07 MED ORDER — ACETAMINOPHEN-CODEINE 300-30 MG PO TABS: 2.0000 | ORAL_TABLET | Freq: Three times a day (TID) | ORAL | 0 refills | Status: AC | PRN

## 2021-11-14 ENCOUNTER — Other Ambulatory Visit: Payer: Self-pay | Admitting: Behavioral Health

## 2021-11-14 DIAGNOSIS — F411 Generalized anxiety disorder: Secondary | ICD-10-CM

## 2021-11-14 DIAGNOSIS — F331 Major depressive disorder, recurrent, moderate: Secondary | ICD-10-CM

## 2021-11-16 ENCOUNTER — Telehealth: Payer: Self-pay | Admitting: Cardiovascular Disease

## 2021-11-16 NOTE — Telephone Encounter (Signed)
Wife informed to have pt call for authorization to speak with her. Wife verbalized understanding and will have pt call

## 2021-11-16 NOTE — Telephone Encounter (Signed)
Pt called to provide permission to pt to wife.  Returned call to wife. Wife questioning if pt is required to have test prior to being cleared for surgery. Per chart review, Dr. Gwenlyn Found recommended ECHO and calcium score prior to surgery. Wife made aware and would like to move up test. Will forward to scheduling to arrange.

## 2021-11-16 NOTE — Telephone Encounter (Signed)
Patient's wife, Colletta [place], is requesting to speak with Dr. Kennon Holter nurse to discuss 10/10 office visit with Dr. Gwenlyn Found.

## 2021-11-16 NOTE — Telephone Encounter (Signed)
Patient calling back.   °

## 2021-11-19 ENCOUNTER — Other Ambulatory Visit: Payer: Self-pay

## 2021-11-19 ENCOUNTER — Ambulatory Visit: Payer: 59 | Admitting: Behavioral Health

## 2021-11-19 ENCOUNTER — Telehealth: Payer: Self-pay | Admitting: Behavioral Health

## 2021-11-19 DIAGNOSIS — F331 Major depressive disorder, recurrent, moderate: Secondary | ICD-10-CM

## 2021-11-19 DIAGNOSIS — F411 Generalized anxiety disorder: Secondary | ICD-10-CM

## 2021-11-19 MED ORDER — ESCITALOPRAM OXALATE 10 MG PO TABS: 10.0000 mg | ORAL_TABLET | Freq: Every day | ORAL | 0 refills | Status: DC

## 2021-11-19 NOTE — Telephone Encounter (Signed)
Rx sent 

## 2021-11-19 NOTE — Telephone Encounter (Signed)
Patient had appt today but was canceled due to provider. He is RS but will not have enough until next appt. States he only has about four. Ph: [phone] appt 11/9 [facility]

## 2021-12-04 ENCOUNTER — Ambulatory Visit (HOSPITAL_COMMUNITY)
Admission: RE | Admit: 2021-12-04 | Discharge: 2021-12-04 | Disposition: A | Discharge status: Home / Self Care | Payer: 59 | Source: Ambulatory Visit | Attending: Cardiovascular Disease | Admitting: Cardiovascular Disease

## 2021-12-04 DIAGNOSIS — Z01818 Encounter for other preprocedural examination: Secondary | ICD-10-CM | POA: Insufficient documentation

## 2021-12-05 ENCOUNTER — Ambulatory Visit (HOSPITAL_COMMUNITY): Payer: 59 | Attending: Cardiovascular Disease

## 2021-12-05 DIAGNOSIS — I351 Nonrheumatic aortic (valve) insufficiency: Secondary | ICD-10-CM

## 2021-12-05 DIAGNOSIS — Z01818 Encounter for other preprocedural examination: Secondary | ICD-10-CM

## 2021-12-05 LAB — ECHOCARDIOGRAM COMPLETE
Area-P 1/2: 2.14 cm2
P 1/2 time: 543 msec
S' Lateral: 3.1 cm

## 2021-12-06 ENCOUNTER — Encounter: Payer: Self-pay | Admitting: Behavioral Health

## 2021-12-06 ENCOUNTER — Encounter: Payer: Self-pay | Admitting: Internal Medicine

## 2021-12-06 ENCOUNTER — Ambulatory Visit: Payer: 59 | Admitting: Behavioral Health

## 2021-12-06 ENCOUNTER — Encounter: Payer: Self-pay | Admitting: Cardiovascular Disease

## 2021-12-06 DIAGNOSIS — R69 Illness, unspecified: Secondary | ICD-10-CM | POA: Diagnosis not present

## 2021-12-06 DIAGNOSIS — F411 Generalized anxiety disorder: Secondary | ICD-10-CM

## 2021-12-06 DIAGNOSIS — F331 Major depressive disorder, recurrent, moderate: Secondary | ICD-10-CM | POA: Diagnosis not present

## 2021-12-06 MED ORDER — ESCITALOPRAM OXALATE 10 MG PO TABS: 10.0000 mg | ORAL_TABLET | Freq: Every day | ORAL | 1 refills | Status: DC

## 2021-12-06 NOTE — Progress Notes (Signed)
[facility]  Patient ID: SHAKAI DOLLEY,  MRN: [id number]  PCP: Marcine Matar, MD  Date of Evaluation: 12/06/2021 Time spent:30 minutes  Chief Complaint:  Chief Complaint   Anxiety; Follow-up; Depression; Patient Education; Medication Refill     HISTORY/CURRENT STATUS: HPI  "[name]", 51 year old male presents to this office for follow up and medication management.  Says the Lexapro "kicked in" and he feels much better. Says he has been able to cope better and wife has noticed the changes. He has not had the crying spells.  He still endorses lack of concentration and feeling scattered at time but believes it is from stress of not having job.  He says his anxiety today is 2 of 10, and depression is 3 of 10.  He is sleeping 6 to 7 hours per night on average.  He says that he is open to trying medication to see if it will help him at this point.  He denies any mania or psychosis.  No history of auditory or visual hallucinations.  Denies SI or HI.   No prior psychiatric medication trials  Past psychotropic medication trials: None Individual Medical History/ Review of Systems: Changes? :No   Allergies: Patient has no known allergies.  Current Medications:  Current Outpatient Medications:    acetaminophen-codeine (TYLENOL #3) 300-30 MG tablet, Take 2 tablets by mouth every 8 (eight) hours as needed for moderate pain., Disp: 60 tablet, Rfl: 0   escitalopram (LEXAPRO) 10 MG tablet, Take 1 tablet (10 mg total) by mouth daily., Disp: 90 tablet, Rfl: 1   OVER THE COUNTER MEDICATION, Adrenal support (Patient not taking: Reported on 11/06/2021), Disp: , Rfl:  Medication Side Effects: none  Family Medical/ Social History: Changes? No  MENTAL HEALTH EXAM:  There were no vitals taken for this visit.There is no height or weight on file to calculate BMI.  General Appearance: Casual, Neat, and Well Groomed  Eye Contact:  Good  Speech:  Clear and Coherent  Volume:  Normal   Mood:  NA  Affect:  Appropriate  Thought Process:  Coherent  Orientation:  Full (Time, Place, and Person)  Thought Content: Logical   Suicidal Thoughts:  No  Homicidal Thoughts:  No  Memory:  WNL  Judgement:  Good  Insight:  Good  Psychomotor Activity:  Normal  Concentration:  Concentration: Good  Recall:  Good  Fund of Knowledge: Good  Language: Good  Assets:  Desire for Improvement  ADL's:  Intact  Cognition: WNL  Prognosis:  Good    DIAGNOSES:    ICD-10-CM   1. Major depressive disorder, recurrent episode, moderate (HCC)  F33.1 escitalopram (LEXAPRO) 10 MG tablet    2. Generalized anxiety disorder  F41.1 escitalopram (LEXAPRO) 10 MG tablet      Receiving Psychotherapy: No    RECOMMENDATIONS:   Greater than 50% of 30 min face to face time with patient was spent on counseling and coordination of care. Discussed his significant progress with anxiety and depression with Lexapro. He is very happy with the medication  and requesting no changes this visit.   We agreed to: Continue Lexapro 10 mg daily after breakfast We will follow-up in 4 weeks to reassess Provided emergency contact information We will report side effects or worsening symptoms promptly Reviewed PDMP       Joan Flores, NP

## 2021-12-12 ENCOUNTER — Encounter: Payer: Self-pay | Admitting: Cardiovascular Disease

## 2021-12-14 DIAGNOSIS — S46012D Strain of muscle(s) and tendon(s) of the rotator cuff of left shoulder, subsequent encounter: Secondary | ICD-10-CM | POA: Diagnosis not present

## 2021-12-14 DIAGNOSIS — M25512 Pain in left shoulder: Secondary | ICD-10-CM | POA: Diagnosis not present

## 2021-12-14 DIAGNOSIS — Z01812 Encounter for preprocedural laboratory examination: Secondary | ICD-10-CM | POA: Diagnosis not present

## 2021-12-19 ENCOUNTER — Other Ambulatory Visit (HOSPITAL_BASED_OUTPATIENT_CLINIC_OR_DEPARTMENT_OTHER): Payer: 59

## 2021-12-24 DIAGNOSIS — S46012D Strain of muscle(s) and tendon(s) of the rotator cuff of left shoulder, subsequent encounter: Secondary | ICD-10-CM | POA: Diagnosis not present

## 2021-12-24 DIAGNOSIS — M25612 Stiffness of left shoulder, not elsewhere classified: Secondary | ICD-10-CM | POA: Diagnosis not present

## 2021-12-24 DIAGNOSIS — M6281 Muscle weakness (generalized): Secondary | ICD-10-CM | POA: Diagnosis not present

## 2021-12-26 DIAGNOSIS — S46012D Strain of muscle(s) and tendon(s) of the rotator cuff of left shoulder, subsequent encounter: Secondary | ICD-10-CM | POA: Diagnosis not present

## 2021-12-26 DIAGNOSIS — M6281 Muscle weakness (generalized): Secondary | ICD-10-CM | POA: Diagnosis not present

## 2021-12-26 DIAGNOSIS — M25612 Stiffness of left shoulder, not elsewhere classified: Secondary | ICD-10-CM | POA: Diagnosis not present

## 2022-01-03 ENCOUNTER — Encounter: Payer: Self-pay | Admitting: Internal Medicine

## 2022-01-10 ENCOUNTER — Ambulatory Visit: Payer: 59 | Admitting: Behavioral Health

## 2022-02-07 ENCOUNTER — Ambulatory Visit (INDEPENDENT_AMBULATORY_CARE_PROVIDER_SITE_OTHER): Payer: 59 | Admitting: Behavioral Health

## 2022-02-07 ENCOUNTER — Encounter: Payer: Self-pay | Admitting: Behavioral Health

## 2022-02-07 DIAGNOSIS — R69 Illness, unspecified: Secondary | ICD-10-CM | POA: Diagnosis not present

## 2022-02-07 DIAGNOSIS — F411 Generalized anxiety disorder: Secondary | ICD-10-CM | POA: Diagnosis not present

## 2022-02-07 DIAGNOSIS — F331 Major depressive disorder, recurrent, moderate: Secondary | ICD-10-CM

## 2022-02-07 NOTE — Progress Notes (Addendum)
[facility]  Patient ID: Jonathan Rasmussen,  MRN: [id number]  PCP: Marcine Matar, MD  Date of Evaluation: 02/07/2022 Time spent:30 minutes  Chief Complaint:  Chief Complaint   Depression; Anxiety; Follow-up; Medication Refill; Patient Education     HISTORY/CURRENT STATUS: HPI "Jonathan Rasmussen", 52 year old male presents to this office for follow up and medication management. No changes this visit. Says the Lexapro "kicked in" and he feels much better. Says he has been able to cope better and wife has noticed the changes. He has not had the crying spells.  He still endorses lack of concentration and feeling scattered at time but believes it is from stress of not having job.  He says his anxiety today is 2 of 10, and depression is 3 of 10.  He is sleeping 6 to 7 hours per night on average.  He says that he is open to trying medication to see if it will help him at this point.  He denies any mania or psychosis.  No history of auditory or visual hallucinations.  Denies SI or HI.   No prior psychiatric medication trials    Individual Medical History/ Review of Systems: Changes? :No   Allergies: Patient has no known allergies.  Current Medications:  Current Outpatient Medications:    acetaminophen-codeine (TYLENOL #3) 300-30 MG tablet, Take 2 tablets by mouth every 8 (eight) hours as needed for moderate pain., Disp: 60 tablet, Rfl: 0   escitalopram (LEXAPRO) 10 MG tablet, Take 1 tablet (10 mg total) by mouth daily., Disp: 90 tablet, Rfl: 1   OVER THE COUNTER MEDICATION, Adrenal support (Patient not taking: Reported on 11/06/2021), Disp: , Rfl:  Medication Side Effects: none  Family Medical/ Social History: Changes? No  MENTAL HEALTH EXAM:  There were no vitals taken for this visit.There is no height or weight on file to calculate BMI.  General Appearance: Casual, Neat, and Well Groomed  Eye Contact:  Good  Speech:  Clear and Coherent  Volume:  Normal  Mood:  Anxious and  Depressed  Affect:  Appropriate  Thought Process:  Coherent  Orientation:  Full (Time, Place, and Person)  Thought Content: Logical   Suicidal Thoughts:  No  Homicidal Thoughts:  No  Memory:  WNL  Judgement:  Good  Insight:  Good  Psychomotor Activity:  Normal  Concentration:  Concentration: Good  Recall:  Good  Fund of Knowledge: Good  Language: Good  Assets:  Desire for Improvement  ADL's:  Intact  Cognition: WNL  Prognosis:  Good    DIAGNOSES: No diagnosis found.  Receiving Psychotherapy: No    RECOMMENDATIONS:   Greater than 50% of 30 min face to face time with patient was spent on counseling and coordination of care. Discussed his significant progress with anxiety and depression with Lexapro. He is very happy with the medication  and requesting no changes this visit.   We agreed to: Continue Lexapro 10 mg daily after breakfast We will follow-up in 2 months to reassess Provided emergency contact information We will report side effects or worsening symptoms promptly Reviewed PDMP    Joan Flores, NP

## 2022-03-03 ENCOUNTER — Encounter: Payer: Self-pay | Admitting: Internal Medicine

## 2022-04-03 ENCOUNTER — Ambulatory Visit: Payer: 59 | Admitting: Mental Health

## 2022-04-08 ENCOUNTER — Ambulatory Visit: Payer: 59 | Admitting: Behavioral Health

## 2022-05-07 ENCOUNTER — Ambulatory Visit (INDEPENDENT_AMBULATORY_CARE_PROVIDER_SITE_OTHER): Payer: 59 | Admitting: Mental Health

## 2022-05-07 DIAGNOSIS — F331 Major depressive disorder, recurrent, moderate: Secondary | ICD-10-CM | POA: Diagnosis not present
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
## 2022-05-08 IMAGING — DX DG ANKLE COMPLETE 3+V*L*
3 series · 3 of 3 positions shown · non-contrast
Comparison: None.

CLINICAL DATA: Trauma, pain

EXAM:
LEFT ANKLE COMPLETE - 3+ VIEW

[ankle ap]
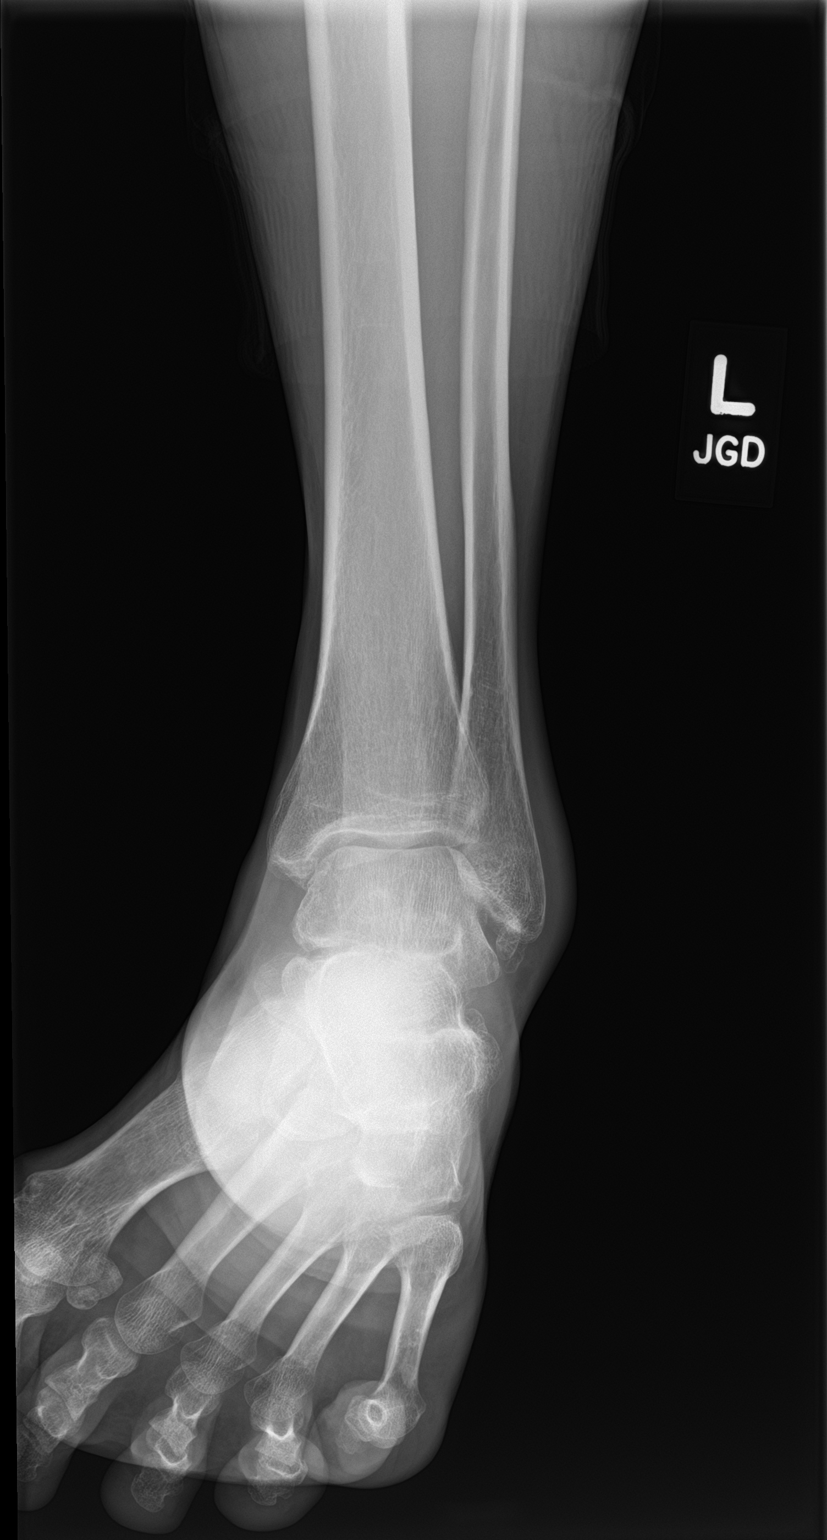

[ankle obl]
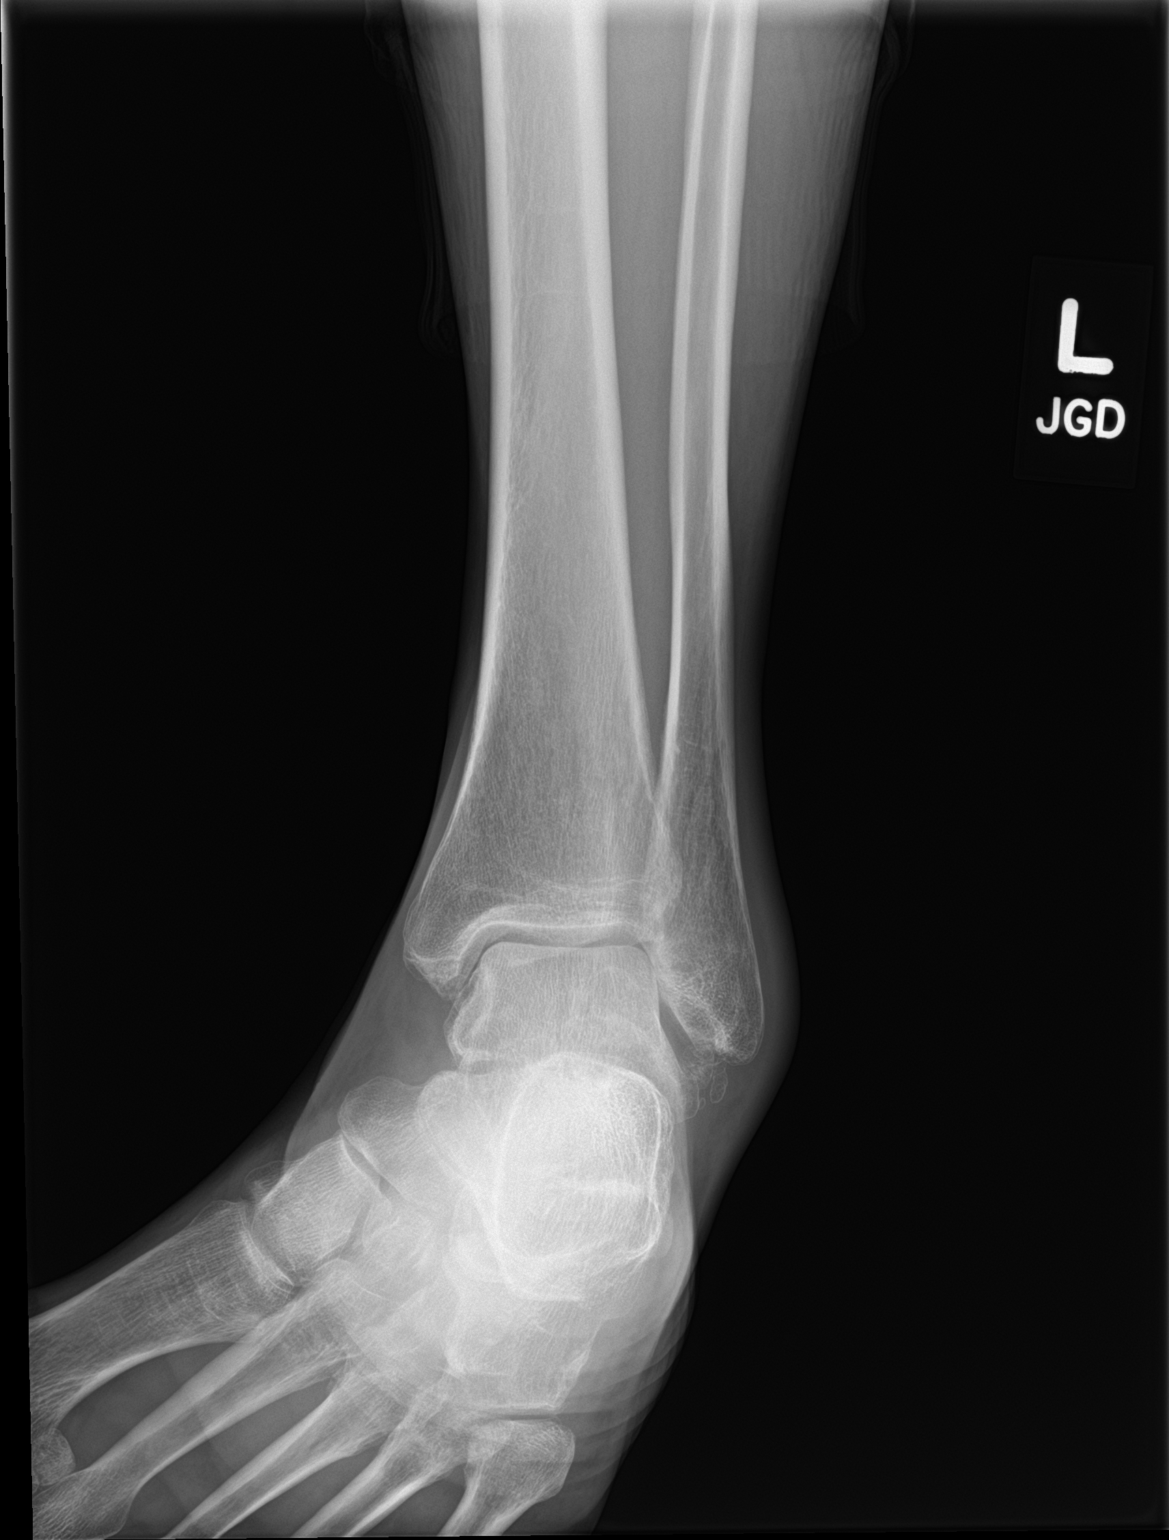

[ankle lat]
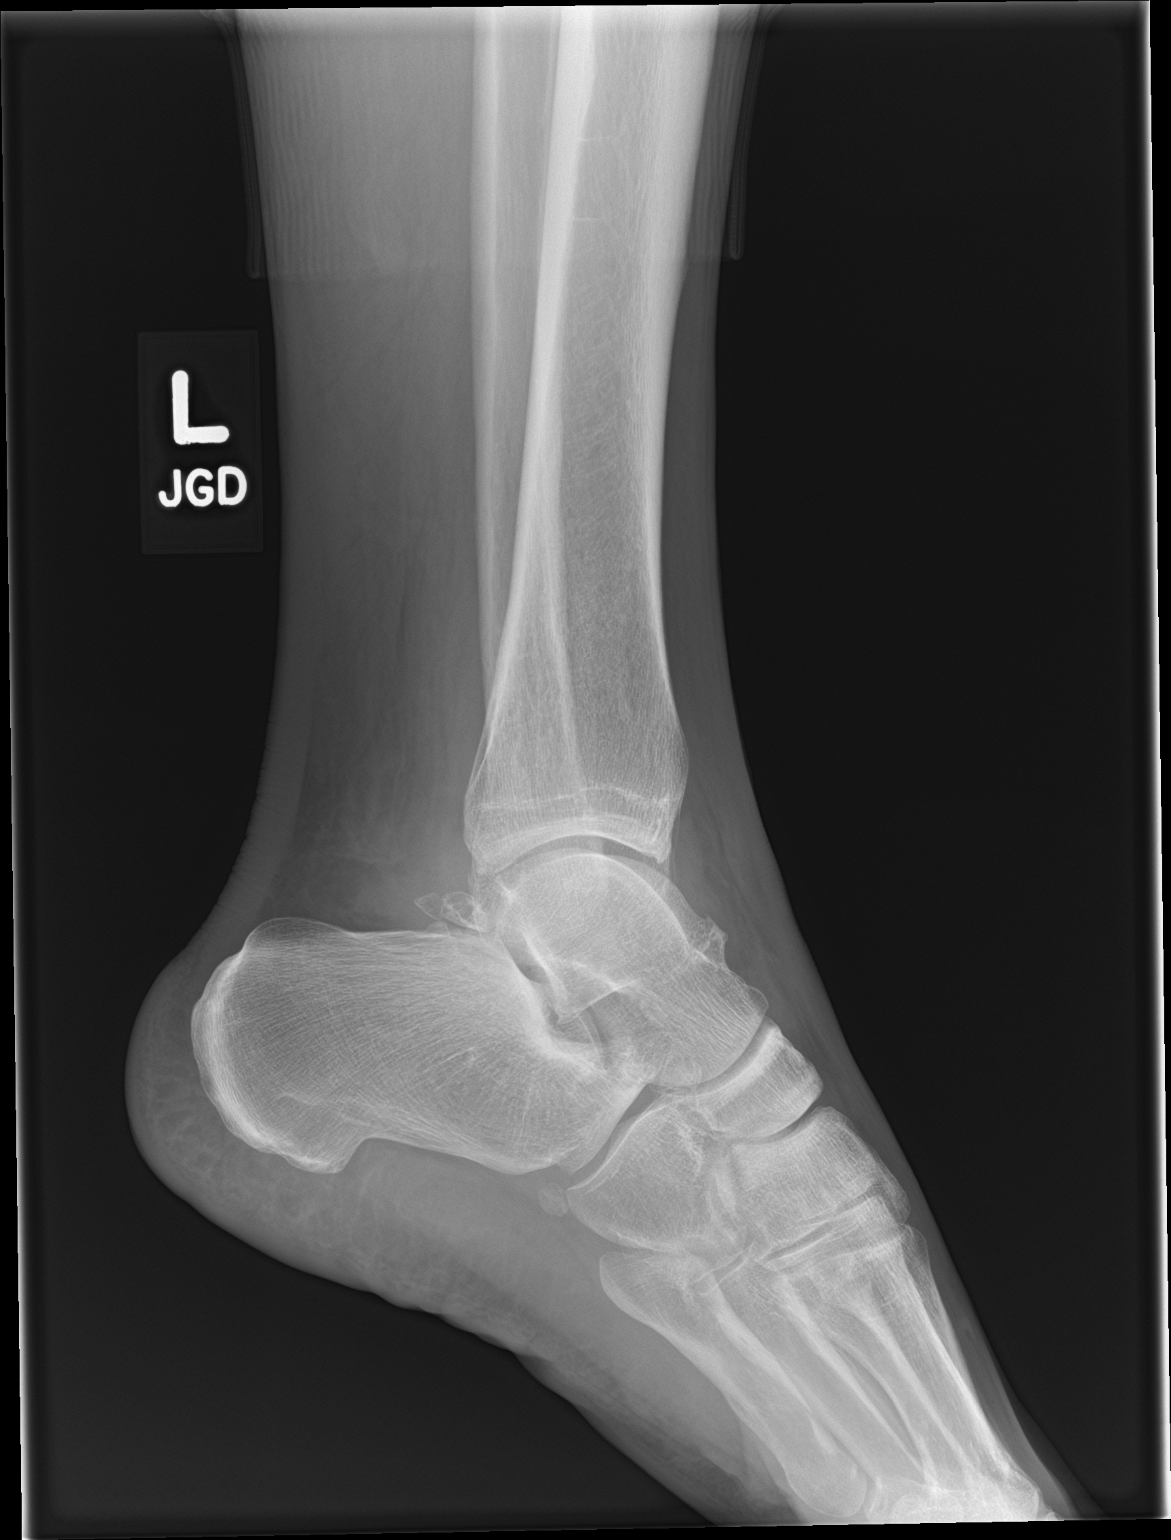

[3 of 3 positions shown; findings below may reference images not displayed]

FINDINGS: There are few smooth marginated calcifications adjacent to the tip
of lateral malleolus. No recent fracture or dislocation is seen.
IMPRESSION: No recent fracture or dislocation is seen in the left ankle. There
are few smooth marginated calcifications adjacent to the tip of
lateral malleolus possibly suggesting old avulsion fractures.

## 2022-05-24 ENCOUNTER — Ambulatory Visit (INDEPENDENT_AMBULATORY_CARE_PROVIDER_SITE_OTHER): Payer: 59 | Admitting: Mental Health

## 2022-05-24 DIAGNOSIS — F331 Major depressive disorder, recurrent, moderate: Secondary | ICD-10-CM

## 2022-05-24 NOTE — Progress Notes (Signed)
[facility] psychotherapy note   Name: Jonathan Rasmussen Date:  05/24/22 MRN: [id number] DOB: October 07, 1970 PCP: Alysia Penna, MD   Time spent: 54 minutes   Treatment: Individual therapy   Mental Status Exam:    Appearance:    Casual     Behavior:   Appropriate  Motor:   Normal  Speech/Language:    Clear and Coherent  Affect:    Full range  Mood:     Euthymic  Thought process:   normal  Thought content:     WNL  Sensory/Perceptual disturbances:     WNL  Orientation:   x4  Attention:   Good  Concentration:   Good  Memory:   WNL  Fund of knowledge:    Good  Insight:     Good  Judgment:    Good  Impulse Control:   Good    Reported Symptoms:  Depressed mood, anxiety (around money, his pain in shoulder, aching), lower motivation, lower concentration/focus, rumination (mainly at night)   Risk Assessment: Danger to Self:  No Self-injurious Behavior: No Danger to Others: No Duty to Warn:no Physical Aggression / Violence:No  Access to Firearms a concern: No  Gang Involvement:No  Patient / guardian was educated about steps to take if suicide or homicide risk level increases between visits: yes While future psychiatric events cannot be accurately predicted, the patient does not currently require acute inpatient psychiatric care and does not currently meet [facility] involuntary commitment criteria.    Medications:       Current Outpatient Medications  Medication Sig Dispense Refill   benzonatate (TESSALON) 100 MG capsule benzonatate 100 mg capsule       dicyclomine (BENTYL) 20 MG tablet Take one every 6-8hours if needed for abdominal cramping 20 tablet 0   Digestive Enzymes (DIGESTIVE ENZYME PO) Take 1 capsule by mouth once a week.       HYDROcodone-acetaminophen (NORCO/VICODIN) 5-325 MG tablet hydrocodone 5 mg-acetaminophen 325 mg tablet  1 - 2 tabs QHS.   may repeat 6 hrs later PRN       ibuprofen (ADVIL,MOTRIN) 200 MG tablet Take 200 mg by mouth every 6 (six)  hours as needed for moderate pain.        pantoprazole (PROTONIX) 20 MG tablet Take 1 tablet (20 mg total) by mouth daily. 30 tablet 0   vitamin C (ASCORBIC ACID) 500 MG tablet Take 500 mg by mouth once a week.        No current facility-administered medications for this visit.      Subjective:  Patient presents for session.  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  He stated that some of it has caused marital strain, he stated that around the time he and his wife were together and eventually got married, there was still some tension related to a past girl that he had been involved with.  Patient states he has been faithful in his marriage, at one point approached by this woman at a music gig in which they both were involved in musically.  He stated that he and his wife lost 2 children, miscarriages the second 1 being in 2017.  He stated that he developed a sense of apathy and appeared to be less tolerant of others, more standoffish.  He stated that about a month ago or so he began to experience a greater sense of control and less reactivity with irritability.  Encouraged him to consider not  only how has been effective for him to make these changes, have increased awareness, discussing concepts related to mindfulness.     Interventions: CBT, supportive therapy, solution focused approaches     Diagnoses:      ICD-10-CM    1. Major depressive disorder, recurrent episode, moderate (HCC)  F33.1        Plan: Patient is to use CBT, mindfulness and coping skills to help manage decrease symptoms. Patient is to create a scheduled exercise regimen.   Long-term goal:  Reduce overall level, frequency, and intensity of the feelings of depression, anxiety and panic so that daily functioning is not impaired.   Short-term goal: To identify and process feelings related to the disappointment of past painful events that increase worthless  feelings.                              Verbally express understanding of the relationship between depressed mood and repression of feelings - such as anger, hurt, and sadness.                              Verbalize an understanding of the role that distorted thinking plays in creating fears, excessive worry, and ruminations.                              Begin engaging in exercise to de-stress.   Assessment of progress:  progressing        Waldron Session, [facility]

## 2022-06-27 ENCOUNTER — Ambulatory Visit: Payer: 59 | Admitting: Mental Health

## 2022-06-27 DIAGNOSIS — F331 Major depressive disorder, recurrent, moderate: Secondary | ICD-10-CM | POA: Diagnosis not present

## 2022-06-27 NOTE — Progress Notes (Signed)
[facility] psychotherapy note   Name: Jonathan Rasmussen Date:  06/27/22 MRN: [id number] DOB: 1970/12/24 PCP: Alysia Penna, MD   Time spent: 54 minutes   Treatment: Individual therapy   Mental Status Exam:    Appearance:    Casual     Behavior:   Appropriate  Motor:   Normal  Speech/Language:    Clear and Coherent  Affect:    Full range  Mood:     Euthymic  Thought process:   normal  Thought content:     WNL  Sensory/Perceptual disturbances:     WNL  Orientation:   x4  Attention:   Good  Concentration:   Good  Memory:   WNL  Fund of knowledge:    Good  Insight:     Good  Judgment:    Good  Impulse Control:   Good    Reported Symptoms:  Depressed mood, anxiety (around money, his pain in shoulder, aching), lower motivation, lower concentration/focus, rumination (mainly at night)   Risk Assessment: Danger to Self:  No Self-injurious Behavior: No Danger to Others: No Duty to Warn:no Physical Aggression / Violence:No  Access to Firearms a concern: No  Gang Involvement:No  Patient / guardian was educated about steps to take if suicide or homicide risk level increases between visits: yes While future psychiatric events cannot be accurately predicted, the patient does not currently require acute inpatient psychiatric care and does not currently meet [facility] involuntary commitment criteria.    Medications:       Current Outpatient Medications  Medication Sig Dispense Refill   benzonatate (TESSALON) 100 MG capsule benzonatate 100 mg capsule       dicyclomine (BENTYL) 20 MG tablet Take one every 6-8hours if needed for abdominal cramping 20 tablet 0   Digestive Enzymes (DIGESTIVE ENZYME PO) Take 1 capsule by mouth once a week.       HYDROcodone-acetaminophen (NORCO/VICODIN) 5-325 MG tablet hydrocodone 5 mg-acetaminophen 325 mg tablet  1 - 2 tabs QHS.   may repeat 6 hrs later PRN       ibuprofen (ADVIL,MOTRIN) 200 MG tablet Take 200 mg by mouth every 6 (six)  hours as needed for moderate pain.        pantoprazole (PROTONIX) 20 MG tablet Take 1 tablet (20 mg total) by mouth daily. 30 tablet 0   vitamin C (ASCORBIC ACID) 500 MG tablet Take 500 mg by mouth once a week.        No current facility-administered medications for this visit.      Subjective:  Patient presents for session.  Patient shared recent events where he and his wife have had recent relationship issues.  He stated that she moved out about 2 weeks ago claiming that she needed to and space to think things through.  He shared how he had improved minimally about a month ago following our last session where he felt less depressed with some increased motivation.  He shared more history related to the challenges in the relationship, financially they have struggled for years but he stated they have been able to be resourceful and get through this challenging times.  Provide support and understanding, facilitating his further identifying his own needs and also collaboratively explored ways he plans to communicate in his relationship.  He stated that he has been less irritable and less defensive as he stated his wife has claimed this about him in the past.      Interventions: CBT, supportive therapy, solution focused  approaches     Diagnoses:      ICD-10-CM    1. Major depressive disorder, recurrent episode, moderate (HCC)  F33.1        Plan: Patient is to use CBT, mindfulness and coping skills to help manage decrease symptoms. Patient is to create a scheduled exercise regimen.   Long-term goal:  Reduce overall level, frequency, and intensity of the feelings of depression, anxiety and panic so that daily functioning is not impaired.   Short-term goal: To identify and process feelings related to the disappointment of past painful events that increase worthless feelings.                              Verbally express understanding of the relationship between depressed mood and repression of  feelings - such as anger, hurt, and sadness.                              Verbalize an understanding of the role that distorted thinking plays in creating fears, excessive worry, and ruminations.                              Begin engaging in exercise to de-stress.   Assessment of progress:  progressing        Waldron Session, [facility]

## 2022-08-06 ENCOUNTER — Ambulatory Visit (INDEPENDENT_AMBULATORY_CARE_PROVIDER_SITE_OTHER): Payer: 59 | Admitting: Mental Health

## 2022-08-06 DIAGNOSIS — F331 Major depressive disorder, recurrent, moderate: Secondary | ICD-10-CM

## 2022-08-06 NOTE — Progress Notes (Signed)
[facility] psychotherapy note   Name: Jonathan Rasmussen Date:  08/06/22 MRN: [id number] DOB: 04-12-1970 PCP: Alysia Penna, MD   Time spent: 52 minutes   Treatment: Individual therapy   Mental Status Exam:    Appearance:    Casual     Behavior:   Appropriate  Motor:   Normal  Speech/Language:    Clear and Coherent  Affect:    Full range  Mood:     Euthymic  Thought process:   normal  Thought content:     WNL  Sensory/Perceptual disturbances:     WNL  Orientation:   x4  Attention:   Good  Concentration:   Good  Memory:   WNL  Fund of knowledge:    Good  Insight:     Good  Judgment:    Good  Impulse Control:   Good    Reported Symptoms:  Depressed mood, anxiety (around money, his pain in shoulder, aching), lower motivation, lower concentration/focus, rumination (mainly at night)   Risk Assessment: Danger to Self:  No Self-injurious Behavior: No Danger to Others: No Duty to Warn:no Physical Aggression / Violence:No  Access to Firearms a concern: No  Gang Involvement:No  Patient / guardian was educated about steps to take if suicide or homicide risk level increases between visits: yes While future psychiatric events cannot be accurately predicted, the patient does not currently require acute inpatient psychiatric care and does not currently meet [facility] involuntary commitment criteria.    Medications:       Current Outpatient Medications  Medication Sig Dispense Refill   benzonatate (TESSALON) 100 MG capsule benzonatate 100 mg capsule       dicyclomine (BENTYL) 20 MG tablet Take one every 6-8hours if needed for abdominal cramping 20 tablet 0   Digestive Enzymes (DIGESTIVE ENZYME PO) Take 1 capsule by mouth once a week.       HYDROcodone-acetaminophen (NORCO/VICODIN) 5-325 MG tablet hydrocodone 5 mg-acetaminophen 325 mg tablet  1 - 2 tabs QHS.   may repeat 6 hrs later PRN       ibuprofen (ADVIL,MOTRIN) 200 MG tablet Take 200 mg by mouth every 6 (six)  hours as needed for moderate pain.        pantoprazole (PROTONIX) 20 MG tablet Take 1 tablet (20 mg total) by mouth daily. 30 tablet 0   vitamin C (ASCORBIC ACID) 500 MG tablet Take 500 mg by mouth once a week.        No current facility-administered medications for this visit.      Subjective:  Patient presents for session.  Patient shared changes since last session, he and his wife remain separated and at this point, he feels they are moving toward divorce.  Provide support as patient expressed feelings related.  Although he was open to couples counseling and had mentioned it to his wife, he stated that she declined the invitation.  Patient stated he continues to cope with financial stress, this being present throughout most of their marriage.  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  Assisted him in identifying thoughts, feelings related to this period of adjustment in his marital relationship.     Interventions: CBT, supportive therapy, solution focused approaches     Diagnoses:      ICD-10-CM    1. Major depressive disorder, recurrent episode, moderate (HCC)  F33.1        Plan: Patient is to use  CBT, mindfulness and coping skills to help manage decrease symptoms. Patient is to create a scheduled exercise regimen.   Long-term goal:  Reduce overall level, frequency, and intensity of the feelings of depression, anxiety and panic so that daily functioning is not impaired.   Short-term goal: To identify and process feelings related to the disappointment of past painful events that increase worthless feelings.                              Verbally express understanding of the relationship between depressed mood and repression of feelings - such as anger, hurt, and sadness.                              Verbalize an understanding of the role that distorted thinking plays in creating fears, excessive worry, and ruminations.                               Begin engaging in exercise to de-stress.   Assessment of progress:  progressing        Waldron Session, [facility]

## 2022-08-19 ENCOUNTER — Other Ambulatory Visit: Payer: Self-pay | Admitting: Behavioral Health

## 2022-08-19 DIAGNOSIS — F411 Generalized anxiety disorder: Secondary | ICD-10-CM

## 2022-08-19 DIAGNOSIS — F331 Major depressive disorder, recurrent, moderate: Secondary | ICD-10-CM

## 2022-08-20 DIAGNOSIS — M47896 Other spondylosis, lumbar region: Secondary | ICD-10-CM | POA: Diagnosis not present

## 2022-08-20 DIAGNOSIS — M9902 Segmental and somatic dysfunction of thoracic region: Secondary | ICD-10-CM | POA: Diagnosis not present

## 2022-08-20 DIAGNOSIS — M62830 Muscle spasm of back: Secondary | ICD-10-CM | POA: Diagnosis not present

## 2022-08-20 DIAGNOSIS — M9903 Segmental and somatic dysfunction of lumbar region: Secondary | ICD-10-CM | POA: Diagnosis not present

## 2022-08-20 DIAGNOSIS — S161XXA Strain of muscle, fascia and tendon at neck level, initial encounter: Secondary | ICD-10-CM | POA: Diagnosis not present

## 2022-08-20 DIAGNOSIS — M9901 Segmental and somatic dysfunction of cervical region: Secondary | ICD-10-CM | POA: Diagnosis not present

## 2022-08-20 NOTE — Telephone Encounter (Signed)
Past due for FU. Send MyChart message.

## 2022-08-23 ENCOUNTER — Encounter: Payer: Self-pay | Admitting: Internal Medicine

## 2022-08-23 ENCOUNTER — Ambulatory Visit: Payer: 59 | Attending: Internal Medicine | Admitting: Internal Medicine

## 2022-08-23 ENCOUNTER — Ambulatory Visit: Payer: Self-pay | Admitting: *Deleted

## 2022-08-23 ENCOUNTER — Telehealth: Payer: Self-pay | Admitting: Behavioral Health

## 2022-08-23 VITALS — BP 114/76 | HR 86 | Temp 98.8°F | Ht 70.0 in | Wt 141.0 lb

## 2022-08-23 DIAGNOSIS — F331 Major depressive disorder, recurrent, moderate: Secondary | ICD-10-CM

## 2022-08-23 DIAGNOSIS — F411 Generalized anxiety disorder: Secondary | ICD-10-CM

## 2022-08-23 DIAGNOSIS — M25512 Pain in left shoulder: Secondary | ICD-10-CM | POA: Diagnosis not present

## 2022-08-23 MED ORDER — ESCITALOPRAM OXALATE 10 MG PO TABS: 10.0000 mg | ORAL_TABLET | Freq: Every day | ORAL | 0 refills | Status: DC

## 2022-08-23 MED ORDER — CYCLOBENZAPRINE HCL 5 MG PO TABS: 5.0000 mg | ORAL_TABLET | Freq: Two times a day (BID) | ORAL | 0 refills | Status: AC | PRN

## 2022-08-23 MED ORDER — MELOXICAM 15 MG PO TABS: 15.0000 mg | ORAL_TABLET | Freq: Every day | ORAL | 1 refills | Status: AC
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
## 2022-08-23 NOTE — Telephone Encounter (Signed)
Summary: pain medication   Patient called in needing a script for meloxicam (MOBIC) 15 MG tablet due to right shoulder pain. 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 Please f/u with patient       Chief Complaint: worsen left shoulder pain ,requesting medication  Symptoms: left should pain , left rib/ middle back pain since going to chiropractor Monday . Hx rotator cuff issues . Frequency: worsening since Monday  Pertinent Negatives: Patient denies N/T in fingers , no swelling Disposition: [] ED /[] Urgent Care (no appt availability in office) / [x] Appointment(In office/virtual)/ []  [facility]/ [] Home Care/ [] Refused Recommended Disposition /[] [place] [facility]/ []  Follow-up with PCP Additional Notes:    Appt already scheduled for today .     Reason for Disposition  [1] Shoulder pains with exertion (e.g., walking) AND [2] pain goes away on resting AND [3] not present now  Answer Assessment - Initial Assessment Questions 1. ONSET: "When did the pain start?"     Monday after seeing chiropractor  2. LOCATION: "Where is the pain located?"     Ongoing but worse since seeing chiropractor  3. PAIN: "How bad is the pain?" (Scale 1-10; or mild, moderate, severe)   - MILD (1-3): doesn't interfere with normal activities   - MODERATE (4-7): interferes with normal activities (e.g., work or school) or awakens from sleep   - SEVERE (8-10): excruciating pain, unable to do any normal activities, unable to move arm at all due to pain     Left shoulder pain and middle back / rib area hurts to cough 4. WORK OR EXERCISE: "Has there been any recent work or exercise that involved this part of the body?"     Saw chiropractor  5. CAUSE: "What do you think is causing the shoulder pain?"     Rotator cuff issues  6. OTHER SYMPTOMS: "Do you have any other symptoms?" (e.g., neck pain, swelling, rash, fever, numbness, weakness)     Left should pain , left rib / mid back  pain  7. PREGNANCY: "Is there any chance you are pregnant?" "When was your last menstrual period?"     na  Protocols used: Shoulder Pain-A-AH

## 2022-08-23 NOTE — Telephone Encounter (Signed)
Patient called in for refill on Lexapro 10mg . States he has about three pills left. Ph: [phone] Appt 7/31 Pharmacy [facility] [address] [place]

## 2022-08-23 NOTE — Telephone Encounter (Signed)
Sent!

## 2022-08-28 ENCOUNTER — Encounter: Payer: Self-pay | Admitting: Behavioral Health

## 2022-08-28 ENCOUNTER — Ambulatory Visit (INDEPENDENT_AMBULATORY_CARE_PROVIDER_SITE_OTHER): Payer: 59 | Admitting: Behavioral Health

## 2022-08-28 DIAGNOSIS — F331 Major depressive disorder, recurrent, moderate: Secondary | ICD-10-CM

## 2022-08-28 DIAGNOSIS — F411 Generalized anxiety disorder: Secondary | ICD-10-CM | POA: Diagnosis not present

## 2022-08-28 MED ORDER — ESCITALOPRAM OXALATE 20 MG PO TABS: 20.0000 mg | ORAL_TABLET | Freq: Every day | ORAL | 3 refills | Status: DC

## 2022-08-28 NOTE — Progress Notes (Signed)
[facility] Med Check  Patient ID: Jonathan Rasmussen,  MRN: [id number]  PCP: Marcine Matar, MD  Date of Evaluation: 08/28/2022 Time spent:30 minutes  Chief Complaint:  Chief Complaint   Anxiety; Depression; Follow-up; Patient Education; Medication Refill     HISTORY/CURRENT STATUS: HPI  Jonathan Rasmussen", 52 year old male presents to this office for follow up and medication management. Separated from his wife now. She is living in separate apartment. He is trying to cope by staying busy. Going for walks.  He says his anxiety today is 4 of 10, and depression is 3 of 10.  He is sleeping 6 to 7 hours per night on average.  He would like to consider a slight increase with his Lexapro to see if it helps.  Will continue in psychotherapy.  He denies any mania or psychosis.  No history of auditory or visual hallucinations.  Denies SI or HI.   No prior psychiatric medication trials   Individual Medical History/ Review of Systems: Changes? :No   Allergies: Patient has no known allergies.  Current Medications:  Current Outpatient Medications:    escitalopram (LEXAPRO) 20 MG tablet, Take 1 tablet (20 mg total) by mouth daily., Disp: 30 tablet, Rfl: 3   acetaminophen-codeine (TYLENOL #3) 300-30 MG tablet, Take 2 tablets by mouth every 8 (eight) hours as needed for moderate pain. (Patient not taking: Reported on 08/23/2022), Disp: 60 tablet, Rfl: 0   cyclobenzaprine (FLEXERIL) 5 MG tablet, Take 1 tablet (5 mg total) by mouth 2 (two) times daily as needed for muscle spasms., Disp: 30 tablet, Rfl: 0   escitalopram (LEXAPRO) 10 MG tablet, Take 1 tablet (10 mg total) by mouth daily., Disp: 30 tablet, Rfl: 0   meloxicam (MOBIC) 15 MG tablet, Take 1 tablet (15 mg total) by mouth daily., Disp: 30 tablet, Rfl: 1 Medication Side Effects: none  Family Medical/ Social History: Changes? No  MENTAL HEALTH EXAM:  There were no vitals taken for this visit.There is no height or weight on file to calculate BMI.   General Appearance: Casual  Eye Contact:  Good  Speech:  Clear and Coherent  Volume:  Normal  Mood:  NA  Affect:  Appropriate  Thought Process:  Coherent  Orientation:  Full (Time, Place, and Person)  Thought Content: Logical   Suicidal Thoughts:  No  Homicidal Thoughts:  No  Memory:  WNL  Judgement:  Good  Insight:  Good  Psychomotor Activity:  Normal  Concentration:  Concentration: Good  Recall:  Good  Fund of Knowledge: Good  Language: Good  Assets:  Desire for Improvement  ADL's:  Intact  Cognition: WNL  Prognosis:  Good     DIAGNOSES:    ICD-10-CM   1. Major depressive disorder, recurrent episode, moderate (HCC)  F33.1 escitalopram (LEXAPRO) 20 MG tablet    2. Generalized anxiety disorder  F41.1 escitalopram (LEXAPRO) 20 MG tablet      Receiving Psychotherapy: No    RECOMMENDATIONS:  Greater than 50% of 30 min face to face time with patient was spent on counseling and coordination of care. Talked about his recent separation from wife.   Is continuing in psychotherapy,and understands his anxiety and stress right now is situational. We talked about healthy coping mechanisms.   We agreed to: Increase Lexapro 20 mg daily after breakfast We will follow-up in 3 months to reassess Provided emergency contact information We will report side effects or worsening symptoms promptly Reviewed PDMP     Joan Flores, NP

## 2022-08-30 ENCOUNTER — Other Ambulatory Visit: Payer: Self-pay

## 2022-08-30 ENCOUNTER — Ambulatory Visit: Payer: 59 | Attending: Internal Medicine

## 2022-08-30 DIAGNOSIS — G8929 Other chronic pain: Secondary | ICD-10-CM | POA: Insufficient documentation

## 2022-08-30 DIAGNOSIS — M6281 Muscle weakness (generalized): Secondary | ICD-10-CM | POA: Diagnosis not present

## 2022-08-30 DIAGNOSIS — M25512 Pain in left shoulder: Secondary | ICD-10-CM | POA: Diagnosis not present
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
## 2022-09-10 ENCOUNTER — Ambulatory Visit: Payer: 59 | Admitting: Mental Health

## 2022-09-10 DIAGNOSIS — F331 Major depressive disorder, recurrent, moderate: Secondary | ICD-10-CM

## 2022-09-10 NOTE — Progress Notes (Unsigned)
[facility] psychotherapy note   Name: Jonathan Rasmussen Date:  09/10/22 MRN: [id number] DOB: [birth year] PCP: Alysia Penna, MD   Time spent: 50 minutes         time in: 3: 00 p.m. time out: 3: 5 0 p.m.   Treatment: Individual therapy   Mental Status Exam:    Appearance:    Casual     Behavior:   Appropriate  Motor:   Normal  Speech/Language:    Clear and Coherent  Affect:    Full range  Mood:     Euthymic  Thought process:   normal  Thought content:     WNL  Sensory/Perceptual disturbances:     WNL  Orientation:   x4  Attention:   Good  Concentration:   Good  Memory:   WNL  Fund of knowledge:    Good  Insight:     Good  Judgment:    Good  Impulse Control:   Good    Reported Symptoms:  Depressed mood, anxiety (around money, his pain in shoulder, aching), lower motivation, lower concentration/focus, rumination (mainly at night)   Risk Assessment: Danger to Self:  No Self-injurious Behavior: No Danger to Others: No Duty to Warn:no Physical Aggression / Violence:No  Access to Firearms a concern: No  Gang Involvement:No  Patient / guardian was educated about steps to take if suicide or homicide risk level increases between visits: yes While future psychiatric events cannot be accurately predicted, the patient does not currently require acute inpatient psychiatric care and does not currently meet [facility] involuntary commitment criteria.    Medications:       Current Outpatient Medications  Medication Sig Dispense Refill   benzonatate (TESSALON) 100 MG capsule benzonatate 100 mg capsule       dicyclomine (BENTYL) 20 MG tablet Take one every 6-8hours if needed for abdominal cramping 20 tablet 0   Digestive Enzymes (DIGESTIVE ENZYME PO) Take 1 capsule by mouth once a week.       HYDROcodone-acetaminophen (NORCO/VICODIN) 5-325 MG tablet hydrocodone 5 mg-acetaminophen 325 mg tablet  1 - 2 tabs QHS.   may repeat 6 hrs later PRN       ibuprofen (ADVIL,MOTRIN)  200 MG tablet Take 200 mg by mouth every 6 (six) hours as needed for moderate pain.        pantoprazole (PROTONIX) 20 MG tablet Take 1 tablet (20 mg total) by mouth daily. 30 tablet 0   vitamin C (ASCORBIC ACID) 500 MG tablet Take 500 mg by mouth once a week.        No current facility-administered medications for this visit.      Subjective:  Patient presents for session.  Assessed progress.  He shared he and his wife continue to remain separated.  He is coping with ongoing stress, stating his wife wants to sell her home however patient wants to keep it.  He went on to share details regarding his attempts to communicate with his wife the struggle that will cause him to have to move out, logistical challenges along with financial stress.  He was continuing to work part-time however, at this point is trying to ready the house by packing.  Provide support as he processed feelings related.  She and the current focus is to sell the house to pay off debts.  Explored ways to communicate with his wife about some of his concerns as this was identified as a need.     Interventions: CBT, supportive therapy,  solution focused approaches     Diagnoses:      ICD-10-CM    1. Major depressive disorder, recurrent episode, moderate (HCC)  F33.1        Plan: Patient is to use CBT, mindfulness and coping skills to help manage decrease symptoms. Patient is to create a scheduled exercise regimen.   Long-term goal:  Reduce overall level, frequency, and intensity of the feelings of depression, anxiety and panic so that daily functioning is not impaired.   Short-term goal: To identify and process feelings related to the disappointment of past painful events that increase worthless feelings.                              Verbally express understanding of the relationship between depressed mood and repression of feelings - such as anger, hurt, and sadness.                              Verbalize an understanding of the  role that distorted thinking plays in creating fears, excessive worry, and ruminations.                              Begin engaging in exercise to de-stress.   Assessment of progress:  progressing        Waldron Session, [facility]
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
## 2022-09-11 ENCOUNTER — Encounter: Payer: Self-pay | Admitting: Physical Therapy

## 2022-09-11 ENCOUNTER — Ambulatory Visit: Payer: 59 | Admitting: Physical Therapy

## 2022-09-11 DIAGNOSIS — M25512 Pain in left shoulder: Secondary | ICD-10-CM | POA: Diagnosis not present

## 2022-09-11 DIAGNOSIS — G8929 Other chronic pain: Secondary | ICD-10-CM

## 2022-09-11 DIAGNOSIS — M6281 Muscle weakness (generalized): Secondary | ICD-10-CM

## 2022-09-14 ENCOUNTER — Other Ambulatory Visit: Payer: Self-pay | Admitting: Behavioral Health

## 2022-09-14 DIAGNOSIS — F331 Major depressive disorder, recurrent, moderate: Secondary | ICD-10-CM

## 2022-09-14 DIAGNOSIS — F411 Generalized anxiety disorder: Secondary | ICD-10-CM

## 2022-09-26 ENCOUNTER — Other Ambulatory Visit: Payer: Self-pay | Admitting: Internal Medicine

## 2022-09-26 ENCOUNTER — Ambulatory Visit: Payer: 59 | Admitting: Physical Therapy

## 2022-09-26 DIAGNOSIS — M25512 Pain in left shoulder: Secondary | ICD-10-CM

## 2022-09-30 ENCOUNTER — Other Ambulatory Visit: Payer: Self-pay | Admitting: Behavioral Health

## 2022-09-30 DIAGNOSIS — F411 Generalized anxiety disorder: Secondary | ICD-10-CM

## 2022-09-30 DIAGNOSIS — F331 Major depressive disorder, recurrent, moderate: Secondary | ICD-10-CM

## 2022-11-27 ENCOUNTER — Ambulatory Visit: Payer: 59 | Admitting: Behavioral Health

## 2022-11-27 ENCOUNTER — Encounter: Payer: Self-pay | Admitting: Behavioral Health

## 2022-11-27 DIAGNOSIS — F331 Major depressive disorder, recurrent, moderate: Secondary | ICD-10-CM

## 2022-11-27 DIAGNOSIS — F330 Major depressive disorder, recurrent, mild: Secondary | ICD-10-CM

## 2022-11-27 DIAGNOSIS — F411 Generalized anxiety disorder: Secondary | ICD-10-CM | POA: Diagnosis not present

## 2022-11-27 MED ORDER — ESCITALOPRAM OXALATE 20 MG PO TABS: 20.0000 mg | ORAL_TABLET | Freq: Every day | ORAL | 0 refills | Status: DC

## 2022-11-27 NOTE — Progress Notes (Signed)
[facility]  Patient ID: RONNI DERKSEN,  MRN: [id number]  PCP: Marcine Matar, MD  Date of Evaluation: 11/27/2022 Time spent:30 minutes  Chief Complaint:  Chief Complaint   Anxiety; Depression; Follow-up; Family Problem; Medication Refill; Patient Education     HISTORY/CURRENT STATUS: HPI "[name]", 52 year old male presents to this office for follow up and medication management. Continued separation from his wife. Living with sister in [facility] temporarily. He would like to move back to [place]. He is trying to cope by staying busy. Going for walks.  He says his anxiety today is 3 of 10, and depression is 3 of 10.  He is sleeping 6 to 7 hours per night on average.  Requested no changes to medications this visit.   Will continue in psychotherapy.  He denies any mania or psychosis.  No history of auditory or visual hallucinations.  Denies SI or HI.   No prior psychiatric medication trials Individual Medical History/ Review of Systems: Changes? :No   Allergies: Patient has no known allergies.  Current Medications:  Current Outpatient Medications:    acetaminophen-codeine (TYLENOL #3) 300-30 MG tablet, Take 2 tablets by mouth every 8 (eight) hours as needed for moderate pain. (Patient not taking: Reported on 08/23/2022), Disp: 60 tablet, Rfl: 0   cyclobenzaprine (FLEXERIL) 5 MG tablet, Take 1 tablet (5 mg total) by mouth 2 (two) times daily as needed for muscle spasms., Disp: 30 tablet, Rfl: 0   escitalopram (LEXAPRO) 10 MG tablet, TAKE 1 TABLET BY MOUTH EVERY DAY, Disp: 30 tablet, Rfl: 1   escitalopram (LEXAPRO) 20 MG tablet, TAKE 1 TABLET BY MOUTH EVERY DAY, Disp: 90 tablet, Rfl: 0   meloxicam (MOBIC) 15 MG tablet, Take 1 tablet (15 mg total) by mouth daily., Disp: 30 tablet, Rfl: 1 Medication Side Effects: none  Family Medical/ Social History: Changes? No  MENTAL HEALTH EXAM:  There were no vitals taken for this visit.There is no height or weight on file  to calculate BMI.  General Appearance: Casual, Neat, and Well Groomed  Eye Contact:  Good  Speech:  Clear and Coherent  Volume:  Normal  Mood:  NA  Affect:  Appropriate  Thought Process:  Coherent  Orientation:  Full (Time, Place, and Person)  Thought Content: Logical   Suicidal Thoughts:  No  Homicidal Thoughts:  No  Memory:  WNL  Judgement:  Good  Insight:  Good  Psychomotor Activity:  Normal  Concentration:  Concentration: Good  Recall:  Good  Fund of Knowledge: Good  Language: Good  Assets:  Desire for Improvement  ADL's:  Intact  Cognition: WNL  Prognosis:  Good    DIAGNOSES: No diagnosis found.  Receiving Psychotherapy: Yes Elio Forget   RECOMMENDATIONS:   Greater than 50% of 30 min face to face time with patient was spent on counseling and coordination of care. Talked about his continued separation from wife that is progressing to divorce. He has accepted the changes and things are more amicable.  Is continuing in psychotherapy,and understands his anxiety and stress right now is situational. We talked about healthy coping mechanisms.    We agreed to:  Increase Lexapro 20 mg daily after breakfast We will follow-up in 3 months to reassess Provided emergency contact information We will report side effects or worsening symptoms promptly Reviewed PDMP      Joan Flores, NP

## 2023-01-05 ENCOUNTER — Other Ambulatory Visit: Payer: Self-pay | Admitting: Behavioral Health

## 2023-01-05 DIAGNOSIS — F411 Generalized anxiety disorder: Secondary | ICD-10-CM

## 2023-01-05 DIAGNOSIS — F331 Major depressive disorder, recurrent, moderate: Secondary | ICD-10-CM

## 2023-01-27 ENCOUNTER — Ambulatory Visit: Payer: 59 | Admitting: Behavioral Health

## 2023-01-29 ENCOUNTER — Other Ambulatory Visit: Payer: Self-pay | Admitting: Behavioral Health

## 2023-01-29 DIAGNOSIS — F331 Major depressive disorder, recurrent, moderate: Secondary | ICD-10-CM

## 2023-01-29 DIAGNOSIS — F411 Generalized anxiety disorder: Secondary | ICD-10-CM

## 2023-04-08 ENCOUNTER — Other Ambulatory Visit: Payer: Self-pay | Admitting: Behavioral Health

## 2023-04-08 DIAGNOSIS — F411 Generalized anxiety disorder: Secondary | ICD-10-CM

## 2023-04-08 DIAGNOSIS — F331 Major depressive disorder, recurrent, moderate: Secondary | ICD-10-CM

## 2023-05-06 ENCOUNTER — Other Ambulatory Visit: Payer: Self-pay | Admitting: Behavioral Health

## 2023-05-06 DIAGNOSIS — F331 Major depressive disorder, recurrent, moderate: Secondary | ICD-10-CM

## 2023-05-06 DIAGNOSIS — F411 Generalized anxiety disorder: Secondary | ICD-10-CM

## 2023-05-28 ENCOUNTER — Other Ambulatory Visit: Payer: Self-pay | Admitting: Behavioral Health

## 2023-05-28 DIAGNOSIS — F411 Generalized anxiety disorder: Secondary | ICD-10-CM

## 2023-05-28 DIAGNOSIS — F331 Major depressive disorder, recurrent, moderate: Secondary | ICD-10-CM

## 2023-06-09 ENCOUNTER — Ambulatory Visit: Admitting: Behavioral Health

## 2023-06-09 ENCOUNTER — Encounter: Payer: Self-pay | Admitting: Behavioral Health

## 2023-06-09 DIAGNOSIS — F331 Major depressive disorder, recurrent, moderate: Secondary | ICD-10-CM

## 2023-06-09 DIAGNOSIS — F411 Generalized anxiety disorder: Secondary | ICD-10-CM

## 2023-06-09 MED ORDER — ESCITALOPRAM OXALATE 20 MG PO TABS: 20.0000 mg | ORAL_TABLET | Freq: Every day | ORAL | 1 refills | Status: DC

## 2023-06-09 NOTE — Progress Notes (Signed)
 [facility]  Patient ID: Jonathan Rasmussen,  MRN: [id number]  PCP: Lawrance Presume, MD  Date of Evaluation: 06/09/2023 Time spent:30 minutes  Chief Complaint:  Chief Complaint   Depression; Anxiety; Follow-up; Patient Education; Medication Refill; Stress     HISTORY/CURRENT STATUS: HPI   "Jonathan Rasmussen", 53 year old male presents to this office for follow up and medication management. Continued separation from his wife. Living with sister in [facility] temporarily. He is trying to cope by staying busy. Going for walks.  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 He says his anxiety today is 3 of 10, and depression is 3 of 10.  He is sleeping 6 to 7 hours per night on average.  Requested no changes to medications this visit.   Will continue in psychotherapy.  He denies any mania or psychosis.  No history of auditory or visual hallucinations.  Denies SI or HI  Individual Medical History/ Review of Systems: Changes? :No   Allergies: Patient has no known allergies.  Current Medications:  Current Outpatient Medications:    acetaminophen -codeine  (TYLENOL  #3) 300-30 MG tablet, Take 2 tablets by mouth every 8 (eight) hours as needed for moderate pain. (Patient not taking: Reported on 08/23/2022), Disp: 60 tablet, Rfl: 0   cyclobenzaprine  (FLEXERIL ) 5 MG tablet, Take 1 tablet (5 mg total) by mouth 2 (two) times daily as needed for muscle spasms., Disp: 30 tablet, Rfl: 0   escitalopram  (LEXAPRO ) 10 MG tablet, TAKE 1 TABLET BY MOUTH EVERY DAY, Disp: 30 tablet, Rfl: 1   escitalopram  (LEXAPRO ) 20 MG tablet, Take 1 tablet (20 mg total) by mouth daily., Disp: 90 tablet, Rfl: 1   meloxicam  (MOBIC ) 15 MG tablet, Take 1 tablet (15 mg total) by mouth daily., Disp: 30 tablet, Rfl: 1 Medication Side Effects: none  Family Medical/ Social History: Changes? No  MENTAL HEALTH EXAM:  There were no vitals taken for this visit.There is no height or weight on file to calculate BMI.  General  Appearance: Casual, Neat, and Well Groomed  Eye Contact:  Good  Speech:  Clear and Coherent  Volume:  Normal  Mood:  NA  Affect:  Appropriate  Thought Process:  Coherent  Orientation:  Full (Time, Place, and Person)  Thought Content: NA   Suicidal Thoughts:  No  Homicidal Thoughts:  No  Memory:  WNL  Judgement:  Good  Insight:  Good  Psychomotor Activity:  Normal  Concentration:  Concentration: Good  Recall:  Good  Fund of Knowledge: Good  Language: Good  Assets:  Desire for Improvement  ADL's:  Intact  Cognition: WNL  Prognosis:  Good    DIAGNOSES:    ICD-10-CM   1. Generalized anxiety disorder  F41.1 escitalopram  (LEXAPRO ) 20 MG tablet    2. Major depressive disorder, recurrent episode, moderate (HCC)  F33.1 escitalopram  (LEXAPRO ) 20 MG tablet      Receiving Psychotherapy: No    RECOMMENDATIONS:  Greater than 50% of 30 min face to face time with patient was spent on counseling and coordination of care. Talked about his stressors of living with sister. Educated pt about not running out of medication and going cold [place]. He did not feel well when this happened this past week. Requesting 90 day supply.  Is continuing in psychotherapy,and understands his anxiety and stress right now is situational. We talked about healthy coping mechanisms.    We agreed to:   To continue Lexapro  20 mg daily after breakfast We will follow-up in 6 months to reassess  Provided emergency contact information We will report side effects or worsening symptoms promptly Reviewed PDMP     Lincoln Renshaw, NP

## 2023-06-10 ENCOUNTER — Telehealth: Payer: Self-pay | Admitting: Behavioral Health

## 2023-06-10 DIAGNOSIS — F411 Generalized anxiety disorder: Secondary | ICD-10-CM

## 2023-06-10 DIAGNOSIS — F331 Major depressive disorder, recurrent, moderate: Secondary | ICD-10-CM

## 2023-06-10 MED ORDER — ESCITALOPRAM OXALATE 20 MG PO TABS: 20.0000 mg | ORAL_TABLET | Freq: Every day | ORAL | 1 refills | Status: DC

## 2023-06-10 NOTE — Telephone Encounter (Signed)
 Patient called in for refill on Lexapro  20mg . States that he was informed that [facility] doesn't accept his insurance and needs it resent to [facility] [address] [facility] Ph: [phone] Appt 5/29

## 2023-06-10 NOTE — Telephone Encounter (Signed)
 Lexapro  sent to [facility] in [facility].

## 2023-06-10 NOTE — Telephone Encounter (Signed)
 Updated pharmacy profile to remove WG.

## 2023-06-26 ENCOUNTER — Ambulatory Visit: Admitting: Mental Health

## 2023-06-26 NOTE — Progress Notes (Signed)
 No charge for missed session 06/26/23.

## 2023-08-25 DIAGNOSIS — S42021A Displaced fracture of shaft of right clavicle, initial encounter for closed fracture: Secondary | ICD-10-CM | POA: Diagnosis not present

## 2023-08-26 DIAGNOSIS — S42021D Displaced fracture of shaft of right clavicle, subsequent encounter for fracture with routine healing: Secondary | ICD-10-CM | POA: Diagnosis not present

## 2023-09-02 DIAGNOSIS — S42021A Displaced fracture of shaft of right clavicle, initial encounter for closed fracture: Secondary | ICD-10-CM | POA: Diagnosis not present

## 2023-09-16 DIAGNOSIS — S42021A Displaced fracture of shaft of right clavicle, initial encounter for closed fracture: Secondary | ICD-10-CM | POA: Diagnosis not present

## 2023-09-25 DIAGNOSIS — S42021A Displaced fracture of shaft of right clavicle, initial encounter for closed fracture: Secondary | ICD-10-CM | POA: Diagnosis not present

## 2023-12-08 ENCOUNTER — Encounter: Payer: Self-pay | Admitting: Behavioral Health

## 2023-12-08 ENCOUNTER — Ambulatory Visit (INDEPENDENT_AMBULATORY_CARE_PROVIDER_SITE_OTHER): Admitting: Behavioral Health

## 2023-12-08 DIAGNOSIS — F331 Major depressive disorder, recurrent, moderate: Secondary | ICD-10-CM

## 2023-12-08 DIAGNOSIS — F3341 Major depressive disorder, recurrent, in partial remission: Secondary | ICD-10-CM

## 2023-12-08 DIAGNOSIS — F411 Generalized anxiety disorder: Secondary | ICD-10-CM | POA: Diagnosis not present

## 2023-12-08 MED ORDER — ESCITALOPRAM OXALATE 20 MG PO TABS: 20.0000 mg | ORAL_TABLET | Freq: Every day | ORAL | 1 refills | Status: DC

## 2023-12-08 NOTE — Progress Notes (Signed)
 [facility]  Patient ID: Jonathan Rasmussen,  MRN: [id number]  PCP: Vicci Barnie NOVAK, MD  Date of Evaluation: 12/08/2023 Time spent:20 minutes  Chief Complaint:  Chief Complaint   Anxiety; Depression; Follow-up; Medication Refill; Patient Education     HISTORY/CURRENT STATUS: HPI  Jonathan Rasmussen, 53 year old male presents to this office for follow up and medication management. Reports divorce has been finalized on Oct. 6 th.  Living with sister in [facility] temporarily. Says that he is doing well right now. Planning on getting his on place soon.  He says his anxiety today is 3 of 10, and depression is 3 of 10.  He is sleeping 6 to 7 hours per night on average.  Requested no changes to medications this visit.   Will continue in psychotherapy.  He denies any mania or psychosis.  No history of auditory or visual hallucinations.  Denies SI or HI        Individual Medical History/ Review of Systems: Changes? :No   Allergies: Patient has no known allergies.  Current Medications:  Current Outpatient Medications:    acetaminophen -codeine  (TYLENOL  #3) 300-30 MG tablet, Take 2 tablets by mouth every 8 (eight) hours as needed for moderate pain. (Patient not taking: Reported on 08/23/2022), Disp: 60 tablet, Rfl: 0   cyclobenzaprine  (FLEXERIL ) 5 MG tablet, Take 1 tablet (5 mg total) by mouth 2 (two) times daily as needed for muscle spasms., Disp: 30 tablet, Rfl: 0   escitalopram  (LEXAPRO ) 20 MG tablet, Take 1 tablet (20 mg total) by mouth daily., Disp: 90 tablet, Rfl: 1   meloxicam  (MOBIC ) 15 MG tablet, Take 1 tablet (15 mg total) by mouth daily., Disp: 30 tablet, Rfl: 1 Medication Side Effects: none  Family Medical/ Social History: Changes? No  MENTAL HEALTH EXAM:  There were no vitals taken for this visit.There is no height or weight on file to calculate BMI.  General Appearance: Casual, Neat, and Well Groomed  Eye Contact:  Good  Speech:  Clear and Coherent  Volume:   Normal  Mood:  NA  Affect:  Appropriate  Thought Process:  Coherent  Orientation:  Full (Time, Place, and Person)  Thought Content: Logical   Suicidal Thoughts:  No  Homicidal Thoughts:  No  Memory:  WNL  Judgement:  Good  Insight:  Good  Psychomotor Activity:  Normal  Concentration:  Concentration: Good  Recall:  Good  Fund of Knowledge: Good  Language: Good  Assets:  Desire for Improvement  ADL's:  Intact  Cognition: WNL  Prognosis:  Good    DIAGNOSES:    ICD-10-CM   1. Generalized anxiety disorder  F41.1 escitalopram  (LEXAPRO ) 20 MG tablet    2. Recurrent major depressive disorder, in partial remission  F33.41     3. Major depressive disorder, recurrent episode, moderate (HCC)  F33.1 escitalopram  (LEXAPRO ) 20 MG tablet      Receiving Psychotherapy: No    RECOMMENDATIONS:   Greater than 50% of 30 min face to face time with patient was spent on counseling and coordination of care. Discussed his current good stability. We talked about healthy coping mechanisms. For now happy with his medications. Requesting no medication changes this visit.    We agreed to:   To continue Lexapro  20 mg daily after breakfast We will follow-up in 6 months to reassess Provided emergency contact information We will report side effects or worsening symptoms promptly Reviewed PDMP     Jonathan DELENA Pizza, NP

## 2024-01-05 ENCOUNTER — Other Ambulatory Visit: Payer: Self-pay | Admitting: Behavioral Health

## 2024-01-05 DIAGNOSIS — F331 Major depressive disorder, recurrent, moderate: Secondary | ICD-10-CM

## 2024-01-05 DIAGNOSIS — F411 Generalized anxiety disorder: Secondary | ICD-10-CM

## 2024-06-07 ENCOUNTER — Ambulatory Visit: Admitting: Behavioral Health
